# Patient Record
Sex: Female | Born: 1985 | Race: Black or African American | Hispanic: No | Marital: Single | Smoking: Never smoker
Health system: Southern US, Community
[De-identification: ages and names within clinical notes are randomized; demographics above are authoritative.]

## PROBLEM LIST (undated history)

## (undated) DIAGNOSIS — IMO0002 Reserved for concepts with insufficient information to code with codable children: Secondary | ICD-10-CM

## (undated) DIAGNOSIS — Z789 Other specified health status: Secondary | ICD-10-CM

## (undated) DIAGNOSIS — G5603 Carpal tunnel syndrome, bilateral upper limbs: Secondary | ICD-10-CM

## (undated) DIAGNOSIS — R87619 Unspecified abnormal cytological findings in specimens from cervix uteri: Secondary | ICD-10-CM

## (undated) DIAGNOSIS — E559 Vitamin D deficiency, unspecified: Secondary | ICD-10-CM

## (undated) DIAGNOSIS — D649 Anemia, unspecified: Secondary | ICD-10-CM

## (undated) DIAGNOSIS — M329 Systemic lupus erythematosus, unspecified: Secondary | ICD-10-CM

## (undated) HISTORY — PX: LYMPHADENECTOMY: SHX15

## (undated) HISTORY — DX: Unspecified abnormal cytological findings in specimens from cervix uteri: R87.619

## (undated) HISTORY — DX: Systemic lupus erythematosus, unspecified: M32.9

## (undated) HISTORY — DX: Carpal tunnel syndrome, bilateral upper limbs: G56.03

## (undated) HISTORY — PX: WISDOM TOOTH EXTRACTION: SHX21

## (undated) HISTORY — PX: NO PAST SURGERIES: SHX2092

## (undated) HISTORY — DX: Reserved for concepts with insufficient information to code with codable children: IMO0002

---
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
## 2004-09-22 ENCOUNTER — Other Ambulatory Visit: Admission: RE | Admit: 2004-09-22 | Discharge: 2004-09-22 | Payer: Self-pay | Admitting: Gynecology

## 2005-01-20 ENCOUNTER — Emergency Department (HOSPITAL_COMMUNITY): Admission: EM | Admit: 2005-01-20 | Discharge: 2005-01-20 | Payer: Self-pay | Admitting: Emergency Medicine

## 2007-10-10 ENCOUNTER — Emergency Department (HOSPITAL_COMMUNITY): Admission: EM | Admit: 2007-10-10 | Discharge: 2007-10-10 | Payer: Self-pay | Admitting: Emergency Medicine

## 2008-02-07 ENCOUNTER — Emergency Department (HOSPITAL_COMMUNITY): Admission: EM | Admit: 2008-02-07 | Discharge: 2008-02-07 | Payer: Self-pay | Admitting: Emergency Medicine

## 2009-01-24 ENCOUNTER — Emergency Department (HOSPITAL_COMMUNITY): Admission: EM | Admit: 2009-01-24 | Discharge: 2009-01-25 | Payer: Self-pay | Admitting: Emergency Medicine

## 2009-05-22 ENCOUNTER — Emergency Department (HOSPITAL_COMMUNITY): Admission: EM | Admit: 2009-05-22 | Discharge: 2009-05-22 | Payer: Self-pay | Admitting: Family Medicine

## 2010-02-24 ENCOUNTER — Inpatient Hospital Stay (HOSPITAL_COMMUNITY)
Admission: AD | Admit: 2010-02-24 | Discharge: 2010-02-27 | Payer: Self-pay | Source: Home / Self Care | Admitting: Obstetrics and Gynecology

## 2010-06-08 LAB — RH IMMUNE GLOB WKUP(>/=20WKS)(NOT WOMEN'S HOSP)
Fetal Screen: NEGATIVE
Unit division: 0

## 2010-06-08 LAB — CBC
HCT: 30.9 % — ABNORMAL LOW (ref 36.0–46.0)
Hemoglobin: 10.5 g/dL — ABNORMAL LOW (ref 12.0–15.0)
MCH: 29.4 pg (ref 26.0–34.0)
MCHC: 34.1 g/dL (ref 30.0–36.0)
MCHC: 33.6 g/dL (ref 30.0–36.0)
Platelets: 220 10*3/uL (ref 150–400)
RBC: 4.53 MIL/uL (ref 3.87–5.11)

## 2010-06-08 LAB — RPR: RPR Ser Ql: NONREACTIVE

## 2010-06-16 LAB — POCT URINALYSIS DIP (DEVICE)
Nitrite: POSITIVE — AB
Protein, ur: 100 mg/dL — AB
Urobilinogen, UA: 1 mg/dL (ref 0.0–1.0)
pH: 6 (ref 5.0–8.0)

## 2011-03-29 NOTE — L&D Delivery Note (Signed)
Delivery Note At 2:00 AM a viable and healthy female was delivered via Vaginal, Spontaneous Delivery (Presentation: Left Occiput; Anterior  ).  APGAR: 9 ,9 ; weight Pending.   Placenta status: Spontaneous, Intact .  #VC  Anesthesia: Epidural  Episiotomy: None Lacerations: 1st degree Suture Repair: 3.0 vicryl Est. Blood Loss (mL): 250cc  Mom to postpartum.  Baby to nursery-stable.  Laura Osborne H. 01/30/2012, 2:18 AM

## 2011-09-23 LAB — OB RESULTS CONSOLE ABO/RH

## 2011-09-23 LAB — OB RESULTS CONSOLE GC/CHLAMYDIA
Chlamydia: NEGATIVE
Gonorrhea: NEGATIVE

## 2011-09-23 LAB — OB RESULTS CONSOLE RUBELLA ANTIBODY, IGM: Rubella: IMMUNE

## 2011-09-23 LAB — OB RESULTS CONSOLE HIV ANTIBODY (ROUTINE TESTING): HIV: NONREACTIVE

## 2011-09-23 LAB — OB RESULTS CONSOLE RPR: RPR: NONREACTIVE

## 2012-01-03 LAB — OB RESULTS CONSOLE GBS: GBS: NEGATIVE

## 2012-01-29 ENCOUNTER — Encounter (HOSPITAL_COMMUNITY): Payer: Self-pay | Admitting: Anesthesiology

## 2012-01-29 ENCOUNTER — Inpatient Hospital Stay (HOSPITAL_COMMUNITY)
Admission: AD | Admit: 2012-01-29 | Discharge: 2012-01-31 | DRG: 373 | Disposition: A | Discharge status: Home / Self Care | Payer: BC Managed Care – PPO | Source: Ambulatory Visit | Attending: Obstetrics and Gynecology | Admitting: Obstetrics and Gynecology

## 2012-01-29 ENCOUNTER — Inpatient Hospital Stay (HOSPITAL_COMMUNITY): Payer: BC Managed Care – PPO | Admitting: Anesthesiology

## 2012-01-29 ENCOUNTER — Encounter (HOSPITAL_COMMUNITY): Payer: Self-pay | Admitting: *Deleted

## 2012-01-29 DIAGNOSIS — Z3480 Encounter for supervision of other normal pregnancy, unspecified trimester: Secondary | ICD-10-CM

## 2012-01-29 HISTORY — DX: Other specified health status: Z78.9

## 2012-01-29 LAB — CBC
HCT: 38.6 % (ref 36.0–46.0)
MCHC: 35 g/dL (ref 30.0–36.0)
Platelets: 179 10*3/uL (ref 150–400)
RDW: 13.4 % (ref 11.5–15.5)
WBC: 11.4 10*3/uL — ABNORMAL HIGH (ref 4.0–10.5)

## 2012-01-29 MED ORDER — FENTANYL 2.5 MCG/ML BUPIVACAINE 1/10 % EPIDURAL INFUSION (WH - ANES)
INTRAMUSCULAR | Status: DC | PRN
  Administered 2012-01-29: 14 mL/h via EPIDURAL

## 2012-01-29 MED ORDER — EPHEDRINE 5 MG/ML INJ: 10.0000 mg | INTRAVENOUS | Status: DC | PRN

## 2012-01-29 MED ORDER — EPHEDRINE 5 MG/ML INJ
10.0000 mg | INTRAVENOUS | Status: DC | PRN
  Filled 2012-01-29: qty 4

## 2012-01-29 MED ORDER — PHENYLEPHRINE 40 MCG/ML (10ML) SYRINGE FOR IV PUSH (FOR BLOOD PRESSURE SUPPORT)
80.0000 ug | PREFILLED_SYRINGE | INTRAVENOUS | Status: DC | PRN
  Filled 2012-01-29: qty 5

## 2012-01-29 MED ORDER — PHENYLEPHRINE 40 MCG/ML (10ML) SYRINGE FOR IV PUSH (FOR BLOOD PRESSURE SUPPORT): 80.0000 ug | PREFILLED_SYRINGE | INTRAVENOUS | Status: DC | PRN

## 2012-01-29 MED ORDER — LIDOCAINE HCL (PF) 1 % IJ SOLN
INTRAMUSCULAR | Status: DC | PRN
  Administered 2012-01-29 (×2): 4 mL

## 2012-01-29 MED ORDER — LACTATED RINGERS IV SOLN
INTRAVENOUS | Status: DC
  Administered 2012-01-29 (×2): via INTRAVENOUS

## 2012-01-29 MED ORDER — ACETAMINOPHEN 325 MG PO TABS: 650.0000 mg | ORAL_TABLET | ORAL | Status: DC | PRN

## 2012-01-29 MED ORDER — FENTANYL 2.5 MCG/ML BUPIVACAINE 1/10 % EPIDURAL INFUSION (WH - ANES)
14.0000 mL/h | INTRAMUSCULAR | Status: DC
  Filled 2012-01-29: qty 125

## 2012-01-29 MED ORDER — FLEET ENEMA 7-19 GM/118ML RE ENEM: 1.0000 | ENEMA | RECTAL | Status: DC | PRN

## 2012-01-29 MED ORDER — IBUPROFEN 600 MG PO TABS: 600.0000 mg | ORAL_TABLET | Freq: Four times a day (QID) | ORAL | Status: DC | PRN

## 2012-01-29 MED ORDER — LACTATED RINGERS IV SOLN: 500.0000 mL | INTRAVENOUS | Status: DC | PRN

## 2012-01-29 MED ORDER — OXYTOCIN 40 UNITS IN LACTATED RINGERS INFUSION - SIMPLE MED
62.5000 mL/h | INTRAVENOUS | Status: DC
  Filled 2012-01-29: qty 1000

## 2012-01-29 MED ORDER — CITRIC ACID-SODIUM CITRATE 334-500 MG/5ML PO SOLN: 30.0000 mL | ORAL | Status: DC | PRN

## 2012-01-29 MED ORDER — ONDANSETRON HCL 4 MG/2ML IJ SOLN: 4.0000 mg | Freq: Four times a day (QID) | INTRAMUSCULAR | Status: DC | PRN

## 2012-01-29 MED ORDER — OXYCODONE-ACETAMINOPHEN 5-325 MG PO TABS
1.0000 | ORAL_TABLET | ORAL | Status: DC | PRN
Start: 2012-01-29 — End: 2012-01-30

## 2012-01-29 MED ORDER — LACTATED RINGERS IV SOLN
500.0000 mL | Freq: Once | INTRAVENOUS | Status: AC
  Administered 2012-01-29: 500 mL via INTRAVENOUS

## 2012-01-29 MED ORDER — LIDOCAINE HCL (PF) 1 % IJ SOLN
30.0000 mL | INTRAMUSCULAR | Status: DC | PRN
  Filled 2012-01-29: qty 30

## 2012-01-29 MED ORDER — OXYTOCIN BOLUS FROM INFUSION
500.0000 mL | INTRAVENOUS | Status: DC
  Administered 2012-01-30: 500 mL via INTRAVENOUS

## 2012-01-29 MED ORDER — DIPHENHYDRAMINE HCL 50 MG/ML IJ SOLN: 12.5000 mg | INTRAMUSCULAR | Status: DC | PRN

## 2012-01-29 NOTE — Anesthesia Preprocedure Evaluation (Signed)

## 2012-01-29 NOTE — Progress Notes (Signed)
Dr Tenny Craw updated on SVE.  To call when pt is complete.

## 2012-01-29 NOTE — Progress Notes (Signed)
Ok for pt to walk x 1hr and recheck

## 2012-01-29 NOTE — MAU Note (Signed)
Pt G2 P1 at 39.2wks having contractions every x 1 hr, passed mucous plug this am.  Denies bleeding or problems with pregnancy.

## 2012-01-29 NOTE — Anesthesia Procedure Notes (Signed)
Epidural Patient location during procedure: OB Start time: 01/29/2012 10:40 PM  Staffing Anesthesiologist: Che Rachal A. Performed by: anesthesiologist   Preanesthetic Checklist Completed: patient identified, site marked, surgical consent, pre-op evaluation, timeout performed, IV checked, risks and benefits discussed and monitors and equipment checked  Epidural Patient position: sitting Prep: site prepped and draped and DuraPrep Patient monitoring: continuous pulse ox and blood pressure Approach: midline Injection technique: LOR air  Needle:  Needle type: Tuohy  Needle gauge: 17 G Needle length: 9 cm and 9 Needle insertion depth: 4 cm Catheter type: closed end flexible Catheter size: 19 Gauge Catheter at skin depth: 9 cm Test dose: negative and Other  Assessment Events: blood not aspirated, injection not painful, no injection resistance, negative IV test and no paresthesia  Additional Notes Patient identified. Risks and benefits discussed including failed block, incomplete  Pain control, post dural puncture headache, nerve damage, paralysis, blood pressure Changes, nausea, vomiting, reactions to medications-both toxic and allergic and post Partum back pain. All questions were answered. Patient expressed understanding and wished to proceed. Sterile technique was used throughout procedure. Epidural site was Dressed with sterile barrier dressing. No paresthesias, signs of intravascular injection Or signs of intrathecal spread were encountered.  Patient was more comfortable after the epidural was dosed. Please see RN's note for documentation of vital signs and FHR which are stable.

## 2012-01-30 ENCOUNTER — Encounter (HOSPITAL_COMMUNITY): Payer: Self-pay | Admitting: *Deleted

## 2012-01-30 MED ORDER — SENNOSIDES-DOCUSATE SODIUM 8.6-50 MG PO TABS
2.0000 | ORAL_TABLET | Freq: Every day | ORAL | Status: DC
  Administered 2012-01-30: 2 via ORAL

## 2012-01-30 MED ORDER — ONDANSETRON HCL 4 MG/2ML IJ SOLN: 4.0000 mg | INTRAMUSCULAR | Status: DC | PRN

## 2012-01-30 MED ORDER — PRENATAL MULTIVITAMIN CH
1.0000 | ORAL_TABLET | Freq: Every day | ORAL | Status: DC
  Administered 2012-01-30: 1 via ORAL
  Filled 2012-01-30 (×2): qty 1

## 2012-01-30 MED ORDER — DIPHENHYDRAMINE HCL 25 MG PO CAPS: 25.0000 mg | ORAL_CAPSULE | Freq: Four times a day (QID) | ORAL | Status: DC | PRN

## 2012-01-30 MED ORDER — LANOLIN HYDROUS EX OINT: TOPICAL_OINTMENT | CUTANEOUS | Status: DC | PRN

## 2012-01-30 MED ORDER — ZOLPIDEM TARTRATE 5 MG PO TABS: 5.0000 mg | ORAL_TABLET | Freq: Every evening | ORAL | Status: DC | PRN

## 2012-01-30 MED ORDER — RHO D IMMUNE GLOBULIN 1500 UNIT/2ML IJ SOLN
300.0000 ug | Freq: Once | INTRAMUSCULAR | Status: AC
  Administered 2012-01-30: 300 ug via INTRAMUSCULAR
  Filled 2012-01-30: qty 2

## 2012-01-30 MED ORDER — SIMETHICONE 80 MG PO CHEW: 80.0000 mg | CHEWABLE_TABLET | ORAL | Status: DC | PRN

## 2012-01-30 MED ORDER — OXYCODONE-ACETAMINOPHEN 5-325 MG PO TABS: 1.0000 | ORAL_TABLET | ORAL | Status: DC | PRN

## 2012-01-30 MED ORDER — IBUPROFEN 600 MG PO TABS
600.0000 mg | ORAL_TABLET | Freq: Four times a day (QID) | ORAL | Status: DC
  Administered 2012-01-30 – 2012-01-31 (×5): 600 mg via ORAL
  Filled 2012-01-30 (×6): qty 1

## 2012-01-30 MED ORDER — DIBUCAINE 1 % RE OINT: 1.0000 "application " | TOPICAL_OINTMENT | RECTAL | Status: DC | PRN

## 2012-01-30 MED ORDER — WITCH HAZEL-GLYCERIN EX PADS: 1.0000 "application " | MEDICATED_PAD | CUTANEOUS | Status: DC | PRN

## 2012-01-30 MED ORDER — TETANUS-DIPHTH-ACELL PERTUSSIS 5-2.5-18.5 LF-MCG/0.5 IM SUSP: 0.5000 mL | Freq: Once | INTRAMUSCULAR | Status: DC

## 2012-01-30 MED ORDER — BENZOCAINE-MENTHOL 20-0.5 % EX AERO: 1.0000 "application " | INHALATION_SPRAY | CUTANEOUS | Status: DC | PRN

## 2012-01-30 MED ORDER — ONDANSETRON HCL 4 MG PO TABS: 4.0000 mg | ORAL_TABLET | ORAL | Status: DC | PRN

## 2012-01-30 NOTE — Anesthesia Postprocedure Evaluation (Signed)
  Anesthesia Post-op Note  Patient: Laura Osborne  Procedure(s) Performed: * No procedures listed *  Patient Location: Mother/Baby  Anesthesia Type:Epidural  Level of Consciousness: awake, alert  and oriented  Airway and Oxygen Therapy: Patient Spontanous Breathing  Post-op Pain: none  Post-op Assessment: Post-op Vital signs reviewed and Patient's Cardiovascular Status Stable  Post-op Vital Signs: Reviewed and stable  Complications: No apparent anesthesia complications

## 2012-01-30 NOTE — H&P (Signed)
Laura Osborne is a 26 y.o. female presenting for contractions 26 yo G2P1001 presents for painful contractions and was deemed to be in labor. Her pregnancy has been complicated by late prenatal care. Otherwise uncomplicated History OB History    Grav Para Term Preterm Abortions TAB SAB Ect Mult Living   2 1 1       1      Past Medical History  Diagnosis Date  . No pertinent past medical history    Past Surgical History  Procedure Date  . No past surgeries    Family History: family history is not on file. Social History:  reports that she has never smoked. She does not have any smokeless tobacco history on file. She reports that she does not drink alcohol or use illicit drugs.   Prenatal Transfer Tool  Maternal Diabetes: No Genetic Screening: Declined Maternal Ultrasounds/Referrals: Normal Fetal Ultrasounds or other Referrals:  None Maternal Substance Abuse:  No Significant Maternal Medications:  None Significant Maternal Lab Results:  None Other Comments:  None  ROS: as above  Dilation: 10 Effacement (%): 100 Station: +2 Exam by:: L Lamon RN Blood pressure 135/65, pulse 65, temperature 98 F (36.7 C), temperature source Oral, resp. rate 18, height 5\' 4"  (1.626 m), weight 77.565 kg (171 lb), SpO2 98.00%. Exam Physical Exam  Prenatal labs: ABO, Rh: AB/Negative/-- (06/28 0000) Antibody: Negative (06/28 0000) Rubella: Immune (06/28 0000) RPR: NON REACTIVE (11/03 2143)  HBsAg: Negative (06/28 0000)  HIV: Non-reactive (06/28 0000)  GBS: Negative (10/08 0000)   Assessment/Plan: Admit Epidural    Philippe Gang H. 01/30/2012, 2:15 AM

## 2012-01-30 NOTE — Progress Notes (Signed)
Dr Tenny Craw notified that pt is complete. Dr is on her way to hospital.

## 2012-01-31 LAB — RH IG WORKUP (INCLUDES ABO/RH)
Fetal Screen: NEGATIVE
Unit division: 0

## 2012-01-31 LAB — CBC
MCH: 28.1 pg (ref 26.0–34.0)
MCHC: 33.4 g/dL (ref 30.0–36.0)
Platelets: 169 10*3/uL (ref 150–400)

## 2012-01-31 MED ORDER — OXYCODONE-ACETAMINOPHEN 5-325 MG PO TABS: 1.0000 | ORAL_TABLET | ORAL | Status: DC | PRN

## 2012-01-31 NOTE — Discharge Summary (Signed)
Obstetric Discharge Summary Reason for Admission: onset of labor Prenatal Procedures: ultrasound Intrapartum Procedures: spontaneous vaginal delivery Postpartum Procedures: none Complications-Operative and Postpartum: 1 degree perineal laceration Hemoglobin  Date Value Range Status  01/31/2012 11.0* 12.0 - 15.0 g/dL Final     DELTA CHECK NOTED     REPEATED TO VERIFY     HCT  Date Value Range Status  01/31/2012 32.6* 36.0 - 46.0 % Final    Physical Exam:  General: alert Lochia: appropriate Uterine Fundus: firm   Discharge Diagnoses: Term Pregnancy-delivered  Discharge Information: Date: 01/31/2012 Activity: pelvic rest Diet: routine Medications: PNV, Ibuprofen and Percocet Condition: stable Instructions: refer to practice specific booklet Discharge to: home Follow-up Information    Follow up with Almon Hercules., MD. Schedule an appointment as soon as possible for a visit in 4 weeks.   Contact information:   [address] [place] [address] [phone]          Newborn Data: Live born female  Birth Weight: 6 lb 13.5 oz (3105 g) APGAR: 9, 9  Home with mother.  Laura Osborne E 01/31/2012, 7:57 AM

## 2012-01-31 NOTE — Progress Notes (Signed)
UR chart review completed.  

## 2012-01-31 NOTE — Progress Notes (Signed)
Pt would like to go home. NO complaints. VSSAF IMP/ doing well. Plan/ discharge.

## 2014-01-27 ENCOUNTER — Encounter (HOSPITAL_COMMUNITY): Payer: Self-pay | Admitting: *Deleted

## 2015-03-29 HISTORY — PX: CERVICAL BIOPSY  W/ LOOP ELECTRODE EXCISION: SUR135

## 2015-04-22 ENCOUNTER — Ambulatory Visit (INDEPENDENT_AMBULATORY_CARE_PROVIDER_SITE_OTHER): Payer: BLUE CROSS/BLUE SHIELD | Admitting: Gynecology

## 2015-04-22 ENCOUNTER — Other Ambulatory Visit (HOSPITAL_COMMUNITY)
Admission: RE | Admit: 2015-04-22 | Discharge: 2015-04-22 | Disposition: A | Discharge status: Home / Self Care | Payer: BLUE CROSS/BLUE SHIELD | Source: Ambulatory Visit | Attending: Gynecology | Admitting: Gynecology

## 2015-04-22 ENCOUNTER — Encounter: Payer: Self-pay | Admitting: Gynecology

## 2015-04-22 VITALS — BP 126/78 | Ht 64.25 in | Wt 159.0 lb

## 2015-04-22 DIAGNOSIS — Z01411 Encounter for gynecological examination (general) (routine) with abnormal findings: Secondary | ICD-10-CM | POA: Diagnosis present

## 2015-04-22 DIAGNOSIS — R87810 Cervical high risk human papillomavirus (HPV) DNA test positive: Secondary | ICD-10-CM | POA: Diagnosis present

## 2015-04-22 DIAGNOSIS — Z1151 Encounter for screening for human papillomavirus (HPV): Secondary | ICD-10-CM | POA: Insufficient documentation

## 2015-04-22 DIAGNOSIS — Z01419 Encounter for gynecological examination (general) (routine) without abnormal findings: Secondary | ICD-10-CM

## 2015-04-22 LAB — CBC WITH DIFFERENTIAL/PLATELET
BASOS ABS: 0 10*3/uL (ref 0.0–0.1)
BASOS PCT: 0 % (ref 0–1)
EOS ABS: 0.1 10*3/uL (ref 0.0–0.7)
EOS PCT: 2 % (ref 0–5)
HCT: 39.2 % (ref 36.0–46.0)
Hemoglobin: 13.2 g/dL (ref 12.0–15.0)
LYMPHS PCT: 26 % (ref 12–46)
Lymphs Abs: 1.6 10*3/uL (ref 0.7–4.0)
MCH: 28.6 pg (ref 26.0–34.0)
MCHC: 33.7 g/dL (ref 30.0–36.0)
MCV: 85 fL (ref 78.0–100.0)
MONO ABS: 0.5 10*3/uL (ref 0.1–1.0)
MPV: 9.6 fL (ref 8.6–12.4)
Monocytes Relative: 9 % (ref 3–12)
Neutro Abs: 3.8 10*3/uL (ref 1.7–7.7)
Neutrophils Relative %: 63 % (ref 43–77)
PLATELETS: 294 10*3/uL (ref 150–400)
RBC: 4.61 MIL/uL (ref 3.87–5.11)
RDW: 13 % (ref 11.5–15.5)
WBC: 6.1 10*3/uL (ref 4.0–10.5)

## 2015-04-22 LAB — COMPREHENSIVE METABOLIC PANEL
ALBUMIN: 4 g/dL (ref 3.6–5.1)
ALT: 10 U/L (ref 6–29)
AST: 13 U/L (ref 10–30)
Alkaline Phosphatase: 53 U/L (ref 33–115)
BILIRUBIN TOTAL: 0.4 mg/dL (ref 0.2–1.2)
BUN: 11 mg/dL (ref 7–25)
CHLORIDE: 105 mmol/L (ref 98–110)
CO2: 23 mmol/L (ref 20–31)
CREATININE: 0.74 mg/dL (ref 0.50–1.10)
Calcium: 9.3 mg/dL (ref 8.6–10.2)
GLUCOSE: 72 mg/dL (ref 65–99)
Potassium: 4.2 mmol/L (ref 3.5–5.3)
SODIUM: 138 mmol/L (ref 135–146)
Total Protein: 7.6 g/dL (ref 6.1–8.1)

## 2015-04-22 LAB — CHOLESTEROL, TOTAL: CHOLESTEROL: 146 mg/dL (ref 125–200)

## 2015-04-22 NOTE — Progress Notes (Signed)
Laura Osborne 1985/07/06 [id number]   History:    30 y.o.  for annual gyn exam who is a new patient to the practice with no complaints today. Patient stated that her last gynecological examination was approximate 3 years ago. She reports no past history of any abnormal Pap smear. She also had a Nexplanon placed on her left arm 3 years ago and needs to be removed and changed at the end of this month. She did not have the HPV vaccine when she was younger. She declined flu vaccine today.  Past medical history,surgical history, family history and social history were all reviewed and documented in the EPIC chart.  Gynecologic History Patient's last menstrual period was 03/30/2015. Contraception: Nexplanon Last Pap: 3 years ago. Results were: normal Last mammogram: Not indicated. Results were: Not indicated  Obstetric History OB History  Gravida Para Term Preterm AB SAB TAB Ectopic Multiple Living  # Outcome Date GA Lbr Len/2nd Weight Sex Delivery Anes PTL Lv  2 Term 01/30/12 [redacted]w[redacted]d 03:31 / 01:29 6 lb 13.5 oz (3.105 kg) F Vag-Spont EPI  Y  1 Term                ROS: A ROS was performed and pertinent positives and negatives are included in the history.  GENERAL: No fevers or chills. HEENT: No change in vision, no earache, sore throat or sinus congestion. NECK: No pain or stiffness. CARDIOVASCULAR: No chest pain or pressure. No palpitations. PULMONARY: No shortness of breath, cough or wheeze. GASTROINTESTINAL: No abdominal pain, nausea, vomiting or diarrhea, melena or bright red blood per rectum. GENITOURINARY: No urinary frequency, urgency, hesitancy or dysuria. MUSCULOSKELETAL: No joint or muscle pain, no back pain, no recent trauma. DERMATOLOGIC: No rash, no itching, no lesions. ENDOCRINE: No polyuria, polydipsia, no heat or cold intolerance. No recent change in weight. HEMATOLOGICAL: No anemia or easy bruising or bleeding. NEUROLOGIC: No headache, seizures, numbness,  tingling or weakness. PSYCHIATRIC: No depression, no loss of interest in normal activity or change in sleep pattern.     Exam: chaperone present  BP 126/78 mmHg  Ht 5' 4.25" (1.632 m)  Wt 159 lb (72.122 kg)  BMI 27.08 kg/m2  LMP 03/30/2015  Body mass index is 27.08 kg/(m^2).  General appearance : Well developed well nourished female. No acute distress HEENT: Eyes: no retinal hemorrhage or exudates,  Neck supple, trachea midline, no carotid bruits, no thyroidmegaly Lungs: Clear to auscultation, no rhonchi or wheezes, or rib retractions  Heart: Regular rate and rhythm, no murmurs or gallops Breast:Examined in sitting and supine position were symmetrical in appearance, no palpable masses or tenderness,  no skin retraction, no nipple inversion, no nipple discharge, no skin discoloration, no axillary or supraclavicular lymphadenopathy Abdomen: no palpable masses or tenderness, no rebound or guarding Extremities: no edema or skin discoloration or tenderness  Pelvic:  Bartholin, Urethra, Skene Glands: Within normal limits             Vagina: No gross lesions or discharge  Cervix: No gross lesions or discharge  Uterus  anteverted, normal size, shape and consistency, non-tender and mobile  Adnexa  Without masses or tenderness  Anus and perineum  normal   Rectovaginal  normal sphincter tone without palpated masses or tenderness             Hemoccult not indicated     Assessment/Plan:  30 y.o. female for annual exam  will return back to the office at the end of the month to remove her expired Nexplanon and replaced with a new one. The following screening blood work was ordered today: Comprehensive metabolic panel, CBC, screening cholesterol, urinalysis. Pap smear with HPV screening done today. Patient declined flu vaccine.   Ok Edwards MD, 2:17 PM 04/22/2015

## 2015-04-23 LAB — URINALYSIS W MICROSCOPIC + REFLEX CULTURE
BACTERIA UA: NONE SEEN [HPF]
BILIRUBIN URINE: NEGATIVE
CRYSTALS: NONE SEEN [HPF]
Casts: NONE SEEN [LPF]
GLUCOSE, UA: NEGATIVE
HGB URINE DIPSTICK: NEGATIVE
Ketones, ur: NEGATIVE
LEUKOCYTES UA: NEGATIVE
Nitrite: NEGATIVE
PROTEIN: NEGATIVE
RBC / HPF: NONE SEEN RBC/HPF (ref <=2)
Specific Gravity, Urine: 1.019 (ref 1.001–1.035)
YEAST: NONE SEEN [HPF]
pH: 7.5 (ref 5.0–8.0)

## 2015-04-24 LAB — CYTOLOGY - PAP

## 2015-04-26 LAB — URINE CULTURE

## 2015-04-27 ENCOUNTER — Telehealth: Payer: Self-pay | Admitting: Gynecology

## 2015-04-27 ENCOUNTER — Other Ambulatory Visit: Payer: Self-pay | Admitting: Gynecology

## 2015-04-27 MED ORDER — NITROFURANTOIN MONOHYD MACRO 100 MG PO CAPS: 100.0000 mg | ORAL_CAPSULE | Freq: Two times a day (BID) | ORAL | Status: DC

## 2015-04-27 NOTE — Telephone Encounter (Signed)
04/27/15-I spoke with pt today to let her know that her [facility] will cover the replacement of her Nexplanon for contraception at 100%, no copay or deductible per [facility], no ref# given.wl

## 2015-05-13 ENCOUNTER — Ambulatory Visit (INDEPENDENT_AMBULATORY_CARE_PROVIDER_SITE_OTHER): Payer: BLUE CROSS/BLUE SHIELD | Admitting: Gynecology

## 2015-05-13 ENCOUNTER — Encounter: Payer: Self-pay | Admitting: Gynecology

## 2015-05-13 VITALS — BP 120/82

## 2015-05-13 DIAGNOSIS — Z30430 Encounter for insertion of intrauterine contraceptive device: Secondary | ICD-10-CM | POA: Diagnosis not present

## 2015-05-13 DIAGNOSIS — Z30017 Encounter for initial prescription of implantable subdermal contraceptive: Secondary | ICD-10-CM

## 2015-05-13 DIAGNOSIS — Z30433 Encounter for removal and reinsertion of intrauterine contraceptive device: Secondary | ICD-10-CM

## 2015-05-13 DIAGNOSIS — Z3046 Encounter for surveillance of implantable subdermal contraceptive: Secondary | ICD-10-CM

## 2015-05-13 DIAGNOSIS — Z975 Presence of (intrauterine) contraceptive device: Secondary | ICD-10-CM | POA: Insufficient documentation

## 2015-05-13 NOTE — Patient Instructions (Signed)
Etonogestrel implant What is this medicine? ETONOGESTREL (et oh noe JES trel) is a contraceptive (birth control) device. It is used to prevent pregnancy. It can be used for up to 3 years. This medicine may be used for other purposes; ask your health care provider or pharmacist if you have questions. What should I tell my health care provider before I take this medicine? They need to know if you have any of these conditions: -abnormal vaginal bleeding -blood vessel disease or blood clots -cancer of the breast, cervix, or liver -depression -diabetes -gallbladder disease -headaches -heart disease or recent heart attack -high blood pressure -high cholesterol -kidney disease -liver disease -renal disease -seizures -tobacco smoker -an unusual or allergic reaction to etonogestrel, other hormones, anesthetics or antiseptics, medicines, foods, dyes, or preservatives -pregnant or trying to get pregnant -breast-feeding How should I use this medicine? This device is inserted just under the skin on the inner side of your upper arm by a health care professional. Talk to your pediatrician regarding the use of this medicine in children. Special care may be needed. Overdosage: If you think you have taken too much of this medicine contact a poison control center or emergency room at once. NOTE: This medicine is only for you. Do not share this medicine with others. What if I miss a dose? This does not apply. What may interact with this medicine? Do not take this medicine with any of the following medications: -amprenavir -bosentan -fosamprenavir This medicine may also interact with the following medications: -barbiturate medicines for inducing sleep or treating seizures -certain medicines for fungal infections like ketoconazole and itraconazole -griseofulvin -medicines to treat seizures like carbamazepine, felbamate, oxcarbazepine, phenytoin,  topiramate -modafinil -phenylbutazone -rifampin -some medicines to treat HIV infection like atazanavir, indinavir, lopinavir, nelfinavir, tipranavir, ritonavir -St. John's wort This list may not describe all possible interactions. Give your health care provider a list of all the medicines, herbs, non-prescription drugs, or dietary supplements you use. Also tell them if you smoke, drink alcohol, or use illegal drugs. Some items may interact with your medicine. What should I watch for while using this medicine? This product does not protect you against HIV infection (AIDS) or other sexually transmitted diseases. You should be able to feel the implant by pressing your fingertips over the skin where it was inserted. Contact your doctor if you cannot feel the implant, and use a non-hormonal birth control method (such as condoms) until your doctor confirms that the implant is in place. If you feel that the implant may have broken or become bent while in your arm, contact your healthcare provider. What side effects may I notice from receiving this medicine? Side effects that you should report to your doctor or health care professional as soon as possible: -allergic reactions like skin rash, itching or hives, swelling of the face, lips, or tongue -breast lumps -changes in emotions or moods -depressed mood -heavy or prolonged menstrual bleeding -pain, irritation, swelling, or bruising at the insertion site -scar at site of insertion -signs of infection at the insertion site such as fever, and skin redness, pain or discharge -signs of pregnancy -signs and symptoms of a blood clot such as breathing problems; changes in vision; chest pain; severe, sudden headache; pain, swelling, warmth in the leg; trouble speaking; sudden numbness or weakness of the face, arm or leg -signs and symptoms of liver injury like dark yellow or brown urine; general ill feeling or flu-like symptoms; light-colored stools; loss of  appetite; nausea; right upper belly   pain; unusually weak or tired; yellowing of the eyes or skin -unusual vaginal bleeding, discharge -signs and symptoms of a stroke like changes in vision; confusion; trouble speaking or understanding; severe headaches; sudden numbness or weakness of the face, arm or leg; trouble walking; dizziness; loss of balance or coordination Side effects that usually do not require medical attention (Report these to your doctor or health care professional if they continue or are bothersome.): -acne -back pain -breast pain -changes in weight -dizziness -general ill feeling or flu-like symptoms -headache -irregular menstrual bleeding -nausea -sore throat -vaginal irritation or inflammation This list may not describe all possible side effects. Call your doctor for medical advice about side effects. You may report side effects to FDA at 1-800-FDA-1088. Where should I keep my medicine? This drug is given in a hospital or clinic and will not be stored at home. NOTE: This sheet is a summary. It may not cover all possible information. If you have questions about this medicine, talk to your doctor, pharmacist, or health care provider.    2016, Elsevier/Gold Standard. (2013-12-27 14:07:06)  

## 2015-05-13 NOTE — Progress Notes (Signed)
Patient is a 30 year old was seen the office as a new patient for the first time in January of this year. She had mentioned that her Nexplanon was due to be changed because it been 3 years and this is the reason for visit today. Her screening blood work were all normal but her Pap smear demonstrated the following:  Diagnosis LOW GRADE SQUAMOUS INTRAEPITHELIAL LESION: CIN-1/ HPV (LSIL); HOWEVER, THERE IS A RARE CELL WHERE A HIGHER GRADE LESION CANNOT BE COMPLETELY EXCLUDED. Zandra Abts MD Pathologist, Electronic Signature (Case signed 04/27/2015) Source CervicoVaginal Pap [ThinPrep Imaged] Molecular Results Test Result HPV 16,18/45 Genotyping NEGATIVE FOR HPV 16 & 18/45 HPV High Risk DETECTED  She is already scheduled for colposcopy next week.                                                             Nexplanon procedure note (removal)  The patient presented to the office today requesting for removal of her Nexplanon that was placed in the year 2014 on her left arm.   On examination the nexplanon implant was palpated and the distal end  (end  closest to the elbow) was marked. The area was sterilized with Betadine solution. 1% lidocaine was used for local anesthesia and approximately 1 cc  was injected into the site that was marked where  the incision was to be made. The local anesthetic was injected under the implant in an effort to keep it  close to the skin surface. Slight pressure pushing downward was made at the proximal end  of the implant in an effort to stabilize it. A bulge appeared indicating the distal end of the implant. Starting at the distal tip of the implant, a small longitudinal incision of 2 mm was made towards the elbow. By gently pushing the implant toward the incision the tip became visible. Grasping the implant with a curved forcep facilitated in gently removing the implant. Full confirmation of the entire implant which is 4 cm long was inspected and was intact and was  shown to the patient and discarded. After removing the implant:                                              Nexplanon Procedure Note (insertion)      The applicator was held above the needle at the textured surface area. The transparent protector was removed. With a freehand, the skin was stretched around the insertion site with a thumb and index finger. The skin was then entered to the previous incision site to removed the oral Nexplanon and with the tip of the needle angled at 30. The Nexplanon applicator was lowered to a horizontal position. While lifting the skin with the tip of the needle the needle was then slid to its full length. The applicator was kept in sitting position with a needle inserted to its full length. The purple slider was unlocked by pushing it slightly downward. The slider was fully moved back until it stopped. This allowed the implant to be in the final subdermal position and the needle was locked inside the body of the applicator. 2 interrupted sutures of 4-0 Vicryl suture was  placed to approximate the skin edges followed by 2 x 2 dressing and a Kerlix wrap which she will remove tomorrow. She'll return back to the office early next week to remove her sutures and later that week she is scheduled for colposcopy as a result of her abnormal Pap smears described above.  [facility] HMD11:53 AMTD@

## 2015-05-14 ENCOUNTER — Encounter: Payer: Self-pay | Admitting: Gynecology

## 2015-05-18 ENCOUNTER — Ambulatory Visit (INDEPENDENT_AMBULATORY_CARE_PROVIDER_SITE_OTHER): Payer: BLUE CROSS/BLUE SHIELD | Admitting: Gynecology

## 2015-05-18 ENCOUNTER — Encounter: Payer: Self-pay | Admitting: Gynecology

## 2015-05-18 VITALS — BP 122/84 | Ht 64.0 in | Wt 159.0 lb

## 2015-05-18 DIAGNOSIS — Z4802 Encounter for removal of sutures: Secondary | ICD-10-CM

## 2015-05-18 NOTE — Progress Notes (Signed)
   Patient is a 30 year old who presented to the office today to remove the sutures that were placed after her Nexplanon had been removed and replaced with a new one on 05/13/2015. She has done well. She is scheduled to return to the office at the end of the week for colposcopic evaluation as a result of her recent Pap smear demonstrated the following:  Diagnosis LOW GRADE SQUAMOUS INTRAEPITHELIAL LESION: CIN-1/ HPV (LSIL); HOWEVER, THERE IS A RARE CELL WHERE A HIGHER GRADE LESION CANNOT BE COMPLETELY EXCLUDED. Zandra Abts MD Pathologist, Electronic Signature (Case signed 04/27/2015) Source CervicoVaginal Pap [ThinPrep Imaged] Molecular Results Test Result HPV 16,18/45 Genotyping NEGATIVE FOR HPV 16 & 18/45 HPV High Risk DETECTED   Left arm suture 2 removed Neosporin applied and a Band-Aid.

## 2015-05-21 ENCOUNTER — Encounter: Payer: Self-pay | Admitting: Gynecology

## 2015-05-21 ENCOUNTER — Ambulatory Visit (INDEPENDENT_AMBULATORY_CARE_PROVIDER_SITE_OTHER): Payer: BLUE CROSS/BLUE SHIELD | Admitting: Gynecology

## 2015-05-21 VITALS — BP 132/90

## 2015-05-21 DIAGNOSIS — R87612 Low grade squamous intraepithelial lesion on cytologic smear of cervix (LGSIL): Secondary | ICD-10-CM | POA: Diagnosis not present

## 2015-05-21 DIAGNOSIS — IMO0002 Reserved for concepts with insufficient information to code with codable children: Secondary | ICD-10-CM | POA: Insufficient documentation

## 2015-05-21 DIAGNOSIS — R896 Abnormal cytological findings in specimens from other organs, systems and tissues: Secondary | ICD-10-CM | POA: Diagnosis not present

## 2015-05-21 NOTE — Patient Instructions (Signed)
Colposcopa - Cuidados posteriores (Colposcopy, Care After) Siga estas instrucciones durante las prximas semanas. Estas indicaciones le proporcionan informacin general acerca de cmo deber cuidarse despus del procedimiento. El mdico tambin podr darle instrucciones ms especficas. El tratamiento se ha planificado de acuerdo a las prcticas mdicas actuales, pero a veces se producen problemas. Comunquese con el mdico si tiene algn problema o tiene dudas despus del procedimiento. QU ESPERAR DESPUS DEL PROCEDIMIENTO  Despus del procedimiento, es tpico tener las siguientes sensaciones:  Clicos. Generalmente se calman en algunos minutos.  Dolor. Puede durar hasta dos das.  Aturdimiento. Si esto le ocurre, recustese durante algunos minutos. Podr tener un sangrado leve o una secrecin oscura que debe detenerse en algunos das. Durante este tiempo deber usar un apsito sanitario. INSTRUCCIONES PARA EL CUIDADO EN EL HOGAR  Evite las relaciones sexuales, las duchas vaginales y el uso de tampones durante 3 das, o segn lo que le indique su mdico.  Tome slo medicamentos de venta libre o recetados, segn las indicaciones del mdico. No tome aspirina, ya que puede causar hemorragias.  Si utiliza pldoras anticonceptivas, contine tomndolas.  No todos los resultados estarn disponibles durante su visita. En este caso, tenga otra entrevista con su mdico para conocerlos. No suponga que es normal si no tiene noticias de su mdico o del establecimiento de salud. Es importante el seguimiento de todos los resultados de los estudios.  Siga los consejos de su mdico con respecto a los medicamentos, actividades, visitas y [place] de control. SOLICITE ATENCIN MDICA SI:  Aparece una erupcin cutnea.  Tiene problemas con los medicamentos. SOLICITE ATENCIN MDICA DE INMEDIATO SI:  Tiene una hemorragia abundante o elimina cogulos.  Tiene fiebre.  Tiene flujo vaginal  anormal.  Tiene clicos que no se alivian luego de tomar analgsicos.  Se siente mareada, tiene vahdos o se desmaya.  Siente dolor en el estmago.   Esta informacin no tiene como fin reemplazar el consejo del mdico. Asegrese de hacerle al mdico cualquier pregunta que tenga.   Document Released: 01/02/2013 Elsevier Interactive Patient Education 2016 Elsevier Inc.  

## 2015-05-21 NOTE — Addendum Note (Signed)
Addended by: Berna Spare A on: 05/21/2015 11:32 AM   Modules accepted: Orders

## 2015-05-21 NOTE — Progress Notes (Signed)
   Patient is a 30 year old was seen in the office as a new patient for the first time on 04/02/2015. The reason for today's visit as a result of patient's abnormal Pap smear recently which demonstrated the following:  Diagnosis LOW GRADE SQUAMOUS INTRAEPITHELIAL LESION: CIN-1/ HPV (LSIL); HOWEVER, THERE IS A RARE CELL WHERE A HIGHER GRADE LESION CANNOT BE COMPLETELY EXCLUDED. Zandra Abts MD Pathologist, Electronic Signature (Case signed 04/27/2015) Source CervicoVaginal Pap [ThinPrep Imaged] Molecular Results Test Result HPV 16,18/45 Genotyping NEGATIVE FOR HPV 16 & 18/45 HPV High Risk DETECTED  Patient was counseled for colposcopic evaluation. Patient underwent a detail colposcopic evaluation of the external genitalia, perineum and perirectal region were no lesions seen. The speculum was introduced into the vagina and a systematic inspection vagina, fornix and cervix was undertaken. Acetowhite areas were noted at the 11, 12, and 1:00 position of the ectocervix. With the use of the endocervical speculum the transformation zone was visualized entirely. These 3 areas were respectively biopsy along with an ECC and labeled appropriately and submitted for histological evaluation. Pictures from finding as follows:  Physical Exam  Genitourinary:     Assessment/plan: Pap smear with low-grade squamous intraepithelial lesion high risk HPV detected but negative for HPV 16, 18 and 45. Detail colposcopic evaluation with abnormal findings as described above. Biopsies obtained. Pathology report pending. Will notify patient with results of findings and manage accordingly. I have shared with her the new ASCCP guidelines to managing abnormal Pap smears. Literature information provided.

## 2015-06-10 ENCOUNTER — Ambulatory Visit: Payer: BLUE CROSS/BLUE SHIELD | Admitting: Gynecology

## 2015-07-27 ENCOUNTER — Ambulatory Visit: Payer: BLUE CROSS/BLUE SHIELD | Admitting: Gynecology

## 2015-09-02 ENCOUNTER — Encounter: Payer: Self-pay | Admitting: Gynecology

## 2015-09-02 ENCOUNTER — Ambulatory Visit (INDEPENDENT_AMBULATORY_CARE_PROVIDER_SITE_OTHER): Payer: 59 | Admitting: Gynecology

## 2015-09-02 VITALS — BP 122/80 | Ht 64.0 in | Wt 159.0 lb

## 2015-09-02 DIAGNOSIS — N871 Moderate cervical dysplasia: Secondary | ICD-10-CM | POA: Insufficient documentation

## 2015-09-02 NOTE — Progress Notes (Signed)
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 Patient currently is using the Nexplanon for contraception. The following outlines her past dysplasia and colposcopic directed biopsies and pathology report:  Pap smear 04/02/2015: Diagnosis LOW GRADE SQUAMOUS INTRAEPITHELIAL LESION: CIN-1/ HPV (LSIL); HOWEVER, THERE IS A RARE CELL WHERE A HIGHER GRADE LESION CANNOT BE COMPLETELY EXCLUDED. Laura Abts MD Pathologist, Electronic Signature (Case signed 04/27/2015) Source CervicoVaginal Pap [ThinPrep Imaged] Molecular Results Test Result HPV 16,18/45 Genotyping NEGATIVE FOR HPV 16 & 18/45 HPV High Risk DETECTED  Patient underwent colposcopy and biopsies every 23rd 2017: Patient was counseled for colposcopic evaluation. Patient underwent a detail colposcopic evaluation of the external genitalia, perineum and perirectal region were no lesions seen. The speculum was introduced into the vagina and a systematic inspection vagina, fornix and cervix was undertaken. Acetowhite areas were noted at the 11, 12, and 1:00 position of the ectocervix. With the use of the endocervical speculum the transformation zone was visualized entirely. These 3 areas were respectively biopsy along with an ECC and labeled appropriately and submitted for histological evaluation. Pictures from finding as follows:  Physical Exam  Genitourinary:     Pathology report demonstrated the following: Diagnosis 1. Endocervix, curettage - DETACHED BENIGN ENDOCERVICAL MUCOSAL FRAGMENTS. - DETACHED BENIGN SQUAMOUS CELLS. - NO DYSPLASIA, ATYPIA OR MALIGNANCY IDENTIFIED. 2. Cervix, biopsy, 1:00 o'clock - TRANSFORMATION ZONE MUCOSA WITH LOW GRADE SQUAMOUS INTRAEPITHELIAL LESION, CIN-I (MILD DYSPLASIA). - ACUTE AND CHRONIC CERVICITIS. 3. Cervix, biopsy, 11:00 o'clock - TRANSFORMATION ZONE MUCOSA WITH HIGH GRADE SQUAMOUS INTRAEPITHELIAL  LESION, CIN-II (MODERATE DYSPLASIA). - ACUTE AND CHRONIC CERVICITIS. 4. Cervix, biopsy, 12:00 o'clock - TRANSFORMATION ZONE MUCOSA WITH HIGH GRADE SQUAMOUS INTRAEPITHELIAL LESION, CIN-II (MODERATE DYSPLASIA). - ACUTE AND CHRONIC CERVICITIS.  Patient was counseled for LEEP cervical conization:  LEEP (Leep electrosurgical excision procedure)    Patient Name:Laura Osborne  Record [id number]  Indication For Surgery: CIN-2  Surgeon: Laura Osborne  Anesthesia: 2% lidocaine   Procedure:  LEEP (Loop electrosurgical excision procedure) Description of Operation:  After the patient was verbally counseled the patient was placed in the low lithotomy position.  A coated speculum was inserted into the vagina and colposcopic examination was performed with 4% acidic acid with findings noted above.  The cervix was then painted with Lugol's solution to delineate the margins of the lesion and transformation zone.  Approximately 5 cc's of 2% xylocaine with epinephrine was infiltrated deep near the outer margin of the transformation zone circumferentially at 12, 3, 6, and 9 o'clock positions.  The [facility] Electrosurgical Generator was then turned on after the patient was grounded with pad electrode on her thigh and jewelry removed.  The settings on the generator were Blend 1 current 70 watts cut and 70 watts on the coagulation mode.  A size 20 x 12 loop electrode was utilized to exercise the atypical transformation zone.  The tip of the electrode was placed 3 mm from the edge of the lesion at 3, 6, 9 and 12 o'clock position.  The electrode was moved slowly over the lesion was within the loop limits.  A vaginal wall retractor was used.  The loop was then repositioned and the finger switch on the hand held piece was activated ( or footpedal depressed).  A slight pressure on the shaft was applied and the loop was extended into the tissue up to its crossbar to a depth of 6 mm, then  with  steady, slow motion across and underneath the endocervical button was not excised.  The loop electrode was replaced with ball electrode set at 50 watts and the base of the crater was fulgurated circumferentially.  Monsell's paint was then applied for additional hemostasis.  A suture was placed at 12 o'clock position of cervical biopsy specimen for orientation, and placed in formation fixative for pathology evaluation.  Patient tolerated the procedure well with minimal blood loss and without any complications.  After the procedure patient left office with stable vital signs and instructions sheet.  Patient to return to the office in 3 weeks for postop visit   [facility] Surgery CenterFERNANDEZ,Laura Stallings HMD12:21 PMTD@  N

## 2015-09-02 NOTE — Patient Instructions (Signed)
Conization of the Cervix  Cervical conization is the cutting (excision) of a cone-shaped portion of the cervix. The procedure is performed through the vagina in either your health care provider's office or an operating room. This procedure is usually done when there is abnormal bleeding from the cervix. It can also be done to evaluate an abnormal Pap test or if an abnormality is seen on the cervix during an exam. The tissue is then examined to see if there are precancerous cells or cancer present.   Conization of the cervix is not done during a menstrual period or pregnancy.   LET YOUR HEALTH CARE PROVIDER KNOW ABOUT:  · Any allergies you have.    · All medicines you are taking, including vitamins, herbs, eye drops, creams, and over-the-counter medicines.    · Previous problems you or members of your family have had with the use of anesthetics.    · Any blood disorders you have.    · Previous surgeries you have had.    · Medical conditions you have.    · Your smoking habits.    · The possibility of being pregnant.    RISKS AND COMPLICATIONS   Generally, conization of the cervix is a safe procedure. However, as with any procedure, complications can occur. Possible complications include:  · Heavy bleeding several days or weeks after the procedure. Light bleeding or spotting after the procedure is normal.  · Infection (rare).  · Damage to the cervix or surrounding organs (uncommon).    · Problems with the anesthesia.    · Increased risk of preterm labor in future pregnancies.  BEFORE THE PROCEDURE  · Do not eat or drink anything for 6-8 hours before the procedure.    · Do not take aspirin or blood thinners for at least a week before the procedure or as directed by your health care provider.    · Arrange for someone to take you home after the procedure.    PROCEDURE  There are three different methods to perform conization of the cervix. These include:   · The cold knife method. In this method a small cone-shaped sample  of tissue is cut out with a knife (scalpel) from the cervical canal and the transformation zone (where the normal cells end and the abnormal cells begin).    · The LEEP method. In this method a small cone-shaped sample of tissue is cut out with a thin wire that can burn (cauterize) the cervical tissue with an electrical current.    · Laser treatment. In this method a small cone-shaped sample of tissue is cut out and then cauterized with a laser beam to prevent bleeding.    The procedure will be performed as follows:   · Depending on the method, you will either be given a medicine to make you sleep (general anesthetic) or a numbing medicine (local anesthetic). A medicine that numbs the cervix (cervical block) may be given.    · A lubricated device called a speculum will be inserted into the vagina to spread open the walls of the vagina. This will help your health care provider see the inside of the vagina and cervix better.    · The tissue from the cervix will be removed and examined.    · The results of the procedure will help your health care provider decide if further treatment is necessary. They will also help your health care provider decide on the best treatment if your results are abnormal.  AFTER THE PROCEDURE  · If you had a general anesthetic, you may be groggy for 2-3 hours after   the procedure.    · If you had a local anesthetic, you will rest at the clinic or hospital until you are stable and feel ready to go home.    · Recovery may take up to 3 weeks.    · You may have some cramping for about 1 week.    · You may have bloody discharge or light bleeding for 1-2 weeks.    · You may have black discharge coming from the vagina. This is from the paste used on the cervix to prevent bleeding. This is normal discharge.       This information is not intended to replace advice given to you by your health care provider. Make sure you discuss any questions you have with your health care provider.     Document  Released: 12/22/2004 Document Revised: 03/19/2013 Document Reviewed: 09/07/2012  Elsevier Interactive Patient Education ©2016 [employer]  Loop Electrosurgical Excision Procedure, Care After  Refer to this sheet in the next few weeks. These instructions provide you with information on caring for yourself after your procedure. Your caregiver may also give you more specific instructions. Your treatment has been planned according to current medical practices, but problems sometimes occur. Call your caregiver if you have any problems or questions after your procedure.  HOME CARE INSTRUCTIONS   · Do not use tampons, douche, or have sexual intercourse for 2 weeks or as directed by your caregiver.  · Begin normal activities if you have no or minimal cramping or bleeding, unless directed otherwise by your caregiver.  · Take your temperature if you feel sick. Write down your temperature on paper, and tell your caregiver if you have a fever.  · Take all medicines as directed by your caregiver.  · Keep all your follow-up appointments and Pap tests as directed by your caregiver.  SEEK IMMEDIATE MEDICAL CARE IF:   · You have bleeding that is heavier or longer than a normal menstrual cycle.  · You have bleeding that is bright red.  · You have blood clots.  · You have a fever.  · You have increasing cramps or pain not relieved by medicine.  · You develop abdominal pain that does not seem to be related to the same area of earlier cramping and pain.  · You are lightheaded, unusually weak, or faint.  · You develop painful or bloody urination.  · You develop a bad smelling vaginal discharge.  MAKE SURE YOU:  · Understand these instructions.  · Will watch your condition.  · Will get help right away if you are not doing well or get worse.     This information is not intended to replace advice given to you by your health care provider. Make sure you discuss any questions you have with your health care provider.     Document Released:  11/25/2010 Document Revised: 04/04/2014 Document Reviewed: 11/25/2010  Elsevier Interactive Patient Education ©2016 [employer] Inc[employer]

## 2015-09-02 NOTE — Addendum Note (Signed)
Addended by: Dayna BarkerGARDNER, Kaleel Schmieder K on: 09/02/2015 12:28 PM   Modules accepted: Orders

## 2015-09-21 ENCOUNTER — Ambulatory Visit: Payer: 59 | Admitting: Gynecology

## 2015-09-24 ENCOUNTER — Ambulatory Visit (INDEPENDENT_AMBULATORY_CARE_PROVIDER_SITE_OTHER): Payer: 59 | Admitting: Gynecology

## 2015-09-24 ENCOUNTER — Encounter: Payer: Self-pay | Admitting: Gynecology

## 2015-09-24 VITALS — BP 130/80

## 2015-09-24 DIAGNOSIS — D069 Carcinoma in situ of cervix, unspecified: Secondary | ICD-10-CM

## 2015-09-24 DIAGNOSIS — Z09 Encounter for follow-up examination after completed treatment for conditions other than malignant neoplasm: Secondary | ICD-10-CM

## 2015-09-24 NOTE — Progress Notes (Addendum)
   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 See previous note June 7 for detail COLPOSCOPIC EVALUATION AND BIOPSY. 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 - LOW GRADE SQUAMOUS INTRAEPITHELIAL LESION, CIN-I (MILD DYSPLASIA). - ENDOCERVICAL AND ECTOCERVICAL RESECTION MARGINS ARE NEGATIVE FOR DYSPLASIA.  Patient is otherwise doing well with no complaints.   ROS: A ROS was performed and pertinent positives and negatives are included in the history.  GENERAL: No fevers or chills. HEENT: No change in vision, no earache, sore throat or sinus congestion. NECK: No pain or stiffness. CARDIOVASCULAR: No chest pain or pressure. No palpitations. PULMONARY: No shortness of breath, cough or wheeze. GASTROINTESTINAL: No abdominal pain, nausea, vomiting or diarrhea, melena or bright red blood per rectum. GENITOURINARY: No urinary frequency, urgency, hesitancy or dysuria. MUSCULOSKELETAL: No joint or muscle pain, no back pain, no recent trauma. DERMATOLOGIC: No rash, no itching, no lesions. ENDOCRINE: No polyuria, polydipsia, no heat or cold intolerance. No recent change in weight. HEMATOLOGICAL: No anemia or easy bruising or bleeding. NEUROLOGIC: No headache, seizures, numbness, tingling or weakness. PSYCHIATRIC: No depression, no loss of interest in normal activity or change in sleep pattern.   PE: Blood pressure 130/80 Gen. appearance well-developed: Nourished female with no complaints Pelvic exam: Bartholin urethra Skene was within normal limits Vagina: No lesions or discharge Cervix: Cervical bed side of LEEP cervical conization healing nicely. 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 Results shared with the patient. Will continue to monitor closely with Pap smear yearly. I would recommend a colposcopy and Pap smear in 12 months. She'll return in 3 weeks for final postop visit. She is currently using the Nexplanon has a form of contraception but she'll refrain from intercourse until her final postop visit in 3 weeks.

## 2015-10-12 ENCOUNTER — Encounter: Payer: Self-pay | Admitting: Gynecology

## 2015-10-12 ENCOUNTER — Ambulatory Visit (INDEPENDENT_AMBULATORY_CARE_PROVIDER_SITE_OTHER): Payer: 59 | Admitting: Gynecology

## 2015-10-12 VITALS — BP 120/78

## 2015-10-12 DIAGNOSIS — D069 Carcinoma in situ of cervix, unspecified: Secondary | ICD-10-CM | POA: Insufficient documentation

## 2015-10-12 DIAGNOSIS — Z09 Encounter for follow-up examination after completed treatment for conditions other than malignant neoplasm: Secondary | ICD-10-CM

## 2015-10-12 NOTE — Progress Notes (Signed)
   Patient is a 30 year old who presented to the office for her six-week postop visit. 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 See previous note June 7 for detail COLPOSCOPIC EVALUATION AND BIOPSY. 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 - LOW GRADE SQUAMOUS INTRAEPITHELIAL LESION, CIN-I (MILD DYSPLASIA). - ENDOCERVICAL AND ECTOCERVICAL RESECTION MARGINS ARE NEGATIVE FOR DYSPLASIA.  Patient is otherwise doing well with no complaints.  Physical exam: Gen. appearance well-developed: Nourished female with no complaints Pelvic exam: Bartholin urethra Skene was within normal limits Vagina: No lesions or discharge Cervix: Cervical bed side of LEEP cervical conization healing nicely. 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 Results shared with the patient. Will continue to monitor closely with Pap smear yearly. I would recommend a colposcopy and Pap smear in 12 months. She is currently using Nexplanon for contraception.

## 2015-10-13 ENCOUNTER — Ambulatory Visit: Payer: 59 | Admitting: Gynecology

## 2016-08-10 ENCOUNTER — Encounter: Payer: Self-pay | Admitting: Gynecology

## 2016-10-18 ENCOUNTER — Ambulatory Visit (INDEPENDENT_AMBULATORY_CARE_PROVIDER_SITE_OTHER): Payer: 59 | Admitting: Gynecology

## 2016-10-18 ENCOUNTER — Encounter: Payer: Self-pay | Admitting: Gynecology

## 2016-10-18 VITALS — BP 122/78 | Ht 64.0 in | Wt 143.0 lb

## 2016-10-18 DIAGNOSIS — Z113 Encounter for screening for infections with a predominantly sexual mode of transmission: Secondary | ICD-10-CM | POA: Diagnosis not present

## 2016-10-18 DIAGNOSIS — Z87410 Personal history of cervical dysplasia: Secondary | ICD-10-CM | POA: Diagnosis not present

## 2016-10-18 DIAGNOSIS — Z01419 Encounter for gynecological examination (general) (routine) without abnormal findings: Secondary | ICD-10-CM | POA: Diagnosis not present

## 2016-10-18 NOTE — Progress Notes (Addendum)
Jake BatheDenetra M CLEGG 1985-09-09 [id number]   History:    31 y.o.  for annual gyn exam requesting an STD screen because of concerns of partner infidelity. She is otherwise asymptomatic. 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 - LOW GRADE SQUAMOUS INTRAEPITHELIAL LESION, CIN-I (MILD DYSPLASIA). - ENDOCERVICAL AND ECTOCERVICAL RESECTION MARGINS ARE NEGATIVE FOR DYSPLASIA  Patient had a Nexplanon changed in 2017   Past medical history,surgical history, family history and social history were all reviewed and documented in the EPIC chart.  Gynecologic History Patient's last menstrual period was 09/27/2016. Contraception: Nexplanon Last Papsee above. Results were: see above  Last mammogram  not indicated. Results were: not indicated   Obstetric History OB History  Gravida Para Term Preterm AB Living  2 2 2     2   SAB TAB Ectopic Multiple Live Births          1    # Outcome Date GA Lbr Len/2nd Weight Sex Delivery Anes PTL Lv  2 Term 01/30/12 [id number] 03:31 / 01:29 6 lb 13.5 oz (3.105 kg) F Vag-Spont EPI  LIV  1 Term                ROS: A ROS was performed and pertinent positives and negatives are included in the history.  GENERAL: No fevers or chills. HEENT: No change in vision, no earache, sore throat or sinus congestion. NECK: No pain or stiffness. CARDIOVASCULAR: No chest pain or pressure. No palpitations. PULMONARY: No shortness of breath, cough or wheeze. GASTROINTESTINAL: No abdominal pain, nausea, vomiting or diarrhea, melena or bright red blood per rectum. GENITOURINARY: No urinary frequency,  urgency, hesitancy or dysuria. MUSCULOSKELETAL: No joint or muscle pain, no back pain, no recent trauma. DERMATOLOGIC: No rash, no itching, no lesions. ENDOCRINE: No polyuria, polydipsia, no heat or cold intolerance. No recent change in weight. HEMATOLOGICAL: No anemia or easy bruising or bleeding. NEUROLOGIC: No headache, seizures, numbness, tingling or weakness. PSYCHIATRIC: No depression, no loss of interest in normal activity or change in sleep pattern.     Exam: chaperone present  BP 122/78   Ht 5\' 4"  (1.626 m)   Wt 143 lb (64.9 kg)   LMP 09/27/2016   BMI 24.55 kg/m   Body mass index is 24.55 kg/m.  General appearance : Well developed well nourished female. No acute distress HEENT: Eyes: no retinal hemorrhage or exudates,  Neck supple, trachea midline, no carotid bruits, no thyroidmegaly Lungs: Clear to auscultation, no rhonchi or wheezes, or rib retractions  Heart: Regular rate and rhythm, no murmurs or gallops Breast:Examined in sitting and supine position were symmetrical in appearance, no palpable masses or tenderness,  no skin retraction, no nipple inversion, no nipple discharge, no skin discoloration, no axillary or supraclavicular lymphadenopathy Abdomen: no palpable masses or tenderness, no rebound or guarding Extremities: no edema or skin discoloration or tenderness  Nexplanon left upper medial arm  Pelvic:  Bartholin, Urethra, Skene Glands: Within normal limits             Vagina: No gross lesions or discharge  Cervix: No gross lesions or discharge  UteAnteverted, normal size, shape and consistency, non-tender and mobile  Adnexa  Without masses or tenderness  Anus and perineum  normal   Rectovaginal  normal  sphincter tone without palpated masses or tenderness             HemNot indicated     Assessment/Plan:  31 y.o. female for annual exahad a full STD screen as per patient's request. GC and Chlamydia culture along with HIV RPR hepatitis B and C will be obtained  today. The following screening blood work was ordered also CBC, screening cholesterol, comprehensive metabolic panel urinalysis and TSH because of patient's weight loss. Patient with past history of CIN-2-CIN-3 had a Pap smear today and recommend have one yearly.  Ok Edwards MD, 2:26 PM 10/18/2016  Requesting an STD because of concern of partner's infidelity

## 2016-10-18 NOTE — Addendum Note (Signed)
Addended by: Kem ParkinsonBARNES, Tydus Sanmiguel on: 10/18/2016 02:32 PM   Modules accepted: Orders

## 2016-10-19 LAB — URINALYSIS W MICROSCOPIC + REFLEX CULTURE
BILIRUBIN URINE: NEGATIVE
Bacteria, UA: NONE SEEN [HPF]
Casts: NONE SEEN [LPF]
Crystals: NONE SEEN [HPF]
GLUCOSE, UA: NEGATIVE
HGB URINE DIPSTICK: NEGATIVE
KETONES UR: NEGATIVE
LEUKOCYTES UA: NEGATIVE
NITRITE: NEGATIVE
PH: 6.5 (ref 5.0–8.0)
Protein, ur: NEGATIVE
RBC / HPF: NONE SEEN RBC/HPF (ref <=2)
SPECIFIC GRAVITY, URINE: 1.021 (ref 1.001–1.035)
Yeast: NONE SEEN [HPF]

## 2016-10-19 LAB — GC/CHLAMYDIA PROBE AMP
CT PROBE, AMP APTIMA: NOT DETECTED
GC Probe RNA: NOT DETECTED

## 2016-10-20 LAB — PAP, TP IMAGING W/ HPV RNA, RFLX HPV TYPE 16,18/45: HPV mRNA, High Risk: NOT DETECTED

## 2016-10-20 LAB — URINE CULTURE

## 2016-11-21 DIAGNOSIS — R200 Anesthesia of skin: Secondary | ICD-10-CM | POA: Diagnosis not present

## 2017-05-10 DIAGNOSIS — G5603 Carpal tunnel syndrome, bilateral upper limbs: Secondary | ICD-10-CM | POA: Diagnosis not present

## 2017-07-17 ENCOUNTER — Ambulatory Visit (INDEPENDENT_AMBULATORY_CARE_PROVIDER_SITE_OTHER): Payer: 59 | Admitting: Neurology

## 2017-07-17 ENCOUNTER — Encounter: Payer: Self-pay | Admitting: Neurology

## 2017-07-17 VITALS — BP 126/83 | HR 74 | Ht 64.0 in | Wt 160.0 lb

## 2017-07-17 DIAGNOSIS — R202 Paresthesia of skin: Secondary | ICD-10-CM | POA: Diagnosis not present

## 2017-07-17 NOTE — Progress Notes (Signed)
Reason for visit: Paresthesias of hands  Referring physician: Dr. Jae Dire is a 32 y.o. female  History of present illness:  Laura Osborne is a 32 year old right-handed black female with a history of numbness and paresthesias affecting both hands off and on for 17 years.  Over time, this has become more frequent and prominent.  The patient oftentimes wake up at night with numbness, the nighttime is the worst time for her symptoms.  She is wearing wrist splints bilaterally but the symptoms are slowly getting worse.  The patient denies any neck pain or pain down the arms.  She denies any weakness of the hands or arms.  She denies any numbness of the feet.  She denies any balance issues or difficulty controlling the bowels or the bladder.  She denies any other symptoms such as headaches or dizziness.  She is sent into the office today for evaluation of possible carpal tunnel syndrome.  The patient does have a sensation of swelling in the hands in the morning.  Past Medical History:  Diagnosis Date  . No pertinent past medical history     Past Surgical History:  Procedure Laterality Date  . NO PAST SURGERIES      Family History  Problem Relation Age of Onset  . Hypertension Maternal Grandmother   . Hypertension Maternal Grandfather   . Diabetes Maternal Grandfather   . Heart disease Maternal Grandfather   . Heart attack Father     Social history:  reports that she has never smoked. She has never used smokeless tobacco. She reports that she drinks alcohol. She reports that she does not use drugs.  Medications:  Prior to Admission medications   Medication Sig Start Date End Date Taking? Authorizing Provider  Echinacea 80 MG CAPS Take by mouth.   Yes [provider]  Prenatal Vit-Fe Fumarate-FA (PRENATAL MULTIVITAMIN) TABS Take 1 tablet by mouth daily. Reported on 04/22/2015   Yes [provider]  b complex vitamins tablet Take 1 tablet by mouth  daily.    [provider]     No Known Allergies  ROS:  Out of a complete 14 system review of symptoms, the patient complains only of the following symptoms, and all other reviewed systems are negative.  Joint pain, joint swelling, aching muscles Numbness, weakness Insomnia  Blood pressure 126/83, pulse 74, height 5\' 4"  (1.626 m), weight 160 lb (72.6 kg).  Physical Exam  General: The patient is alert and cooperative at the time of the examination.  Eyes: Pupils are equal, round, and reactive to light. Discs are flat bilaterally.  Neck: The neck is supple, no carotid bruits are noted.  Respiratory: The respiratory examination is clear.  Cardiovascular: The cardiovascular examination reveals a regular rate and rhythm, no obvious murmurs or rubs are noted.  Neuromuscular: Range of movement the neck is full.  Skin: Extremities are without significant edema.  Neurologic Exam  Mental status: The patient is alert and oriented x 3 at the time of the examination. The patient has apparent normal recent and remote memory, with an apparently normal attention span and concentration ability.  Cranial nerves: Facial symmetry is present. There is good sensation of the face to pinprick and soft touch bilaterally. The strength of the facial muscles and the muscles to head turning and shoulder shrug are normal bilaterally. Speech is well enunciated, no aphasia or dysarthria is noted. Extraocular movements are full. Visual fields are full. The tongue is midline, and  the patient has symmetric elevation of the soft palate. No obvious hearing deficits are noted.  Motor: The motor testing reveals 5 over 5 strength of all 4 extremities. Good symmetric motor tone is noted throughout.  Sensory: Sensory testing is intact to pinprick, soft touch, vibration sensation, and position sense on all 4 extremities. No evidence of extinction is noted.  Coordination: Cerebellar testing reveals good  finger-nose-finger and heel-to-shin bilaterally.  Tinel sign of the wrists is negative bilaterally.  Gait and station: Gait is normal. Tandem gait is normal. Romberg is negative. No drift is seen.  Reflexes: Deep tendon reflexes are symmetric and normal bilaterally. Toes are downgoing bilaterally.   Assessment/Plan:  1.  Bilateral hand numbness, possible carpal tunnel syndrome  The patient will be set up for nerve conduction studies on both arms, and EMG on one arm.  The patient may have very mild carpal tunnel syndrome.  Laura Osborne. Keith Willis MD 07/17/2017 9:49 AM  [facility] [address] [place], [place] [id number]  Phone [phone] Fax [phone]

## 2017-08-14 ENCOUNTER — Encounter: Payer: Self-pay | Admitting: Neurology

## 2017-08-14 ENCOUNTER — Ambulatory Visit (INDEPENDENT_AMBULATORY_CARE_PROVIDER_SITE_OTHER): Payer: 59 | Admitting: Neurology

## 2017-08-14 DIAGNOSIS — R202 Paresthesia of skin: Secondary | ICD-10-CM

## 2017-08-14 DIAGNOSIS — G5603 Carpal tunnel syndrome, bilateral upper limbs: Secondary | ICD-10-CM

## 2017-08-14 DIAGNOSIS — G5601 Carpal tunnel syndrome, right upper limb: Secondary | ICD-10-CM | POA: Insufficient documentation

## 2017-08-14 HISTORY — DX: Carpal tunnel syndrome, bilateral upper limbs: G56.03

## 2017-08-14 NOTE — Progress Notes (Addendum)
The patient comes in today for EMG nerve conduction study that shows evidence of mild bilateral carpal tunnel syndrome.  The patient has been using wrist splints for several years without benefit, we will make a referral to a hand surgeon.    MNC    Nerve / Sites Muscle Latency Ref. Amplitude Ref. Rel Amp Segments Distance Velocity Ref. Area    ms ms mV mV %  cm m/s m/s mVms  L Median - APB     Wrist APB 5.0 ?4.4 8.5 ?4.0 100 Wrist - APB 7   26.7     Upper arm APB 8.9  8.1  95.3 Upper arm - Wrist 22 57 ?49 25.4  R Median - APB     Wrist APB 5.2 ?4.4 6.8 ?4.0 100 Wrist - APB 7   19.8     Upper arm APB 8.7  6.5  96.6 Upper arm - Wrist 21 59 ?49 19.0  L Ulnar - ADM     Wrist ADM 2.3 ?3.3 9.4 ?6.0 100 Wrist - ADM 7   29.6     B.Elbow ADM 5.3  9.2  97.4 B.Elbow - Wrist 17 56 ?49 29.9     A.Elbow ADM 7.2  9.1  98.7 A.Elbow - B.Elbow 10 52 ?49 29.7         A.Elbow - Wrist      R Ulnar - ADM     Wrist ADM 2.4 ?3.3 6.4 ?6.0 100 Wrist - ADM 7   22.7     B.Elbow ADM 5.8  6.0  94.8 B.Elbow - Wrist 18 53 ?49 22.4     A.Elbow ADM 7.6  6.1  101 A.Elbow - B.Elbow 10 55 ?49 22.7         A.Elbow - Wrist                 SNC    Nerve / Sites Rec. Site Peak Lat Ref.  Amp Ref. Segments Distance    ms ms V V  cm  L Median - Orthodromic (Dig II, Mid palm)     Dig II Wrist 4.0 ?3.4 7 ?10 Dig II - Wrist 13  R Median - Orthodromic (Dig II, Mid palm)     Dig II Wrist 4.4 ?3.4 2 ?10 Dig II - Wrist 13  L Ulnar - Orthodromic, (Dig V, Mid palm)     Dig V Wrist 2.3 ?3.1 9 ?5 Dig V - Wrist 11  R Ulnar - Orthodromic, (Dig V, Mid palm)     Dig V Wrist 2.3 ?3.1 11 ?5 Dig V - Wrist 72             F  Wave    Nerve F Lat Ref.   ms ms  L Ulnar - ADM 25.0 ?32.0  R Ulnar - ADM 24.9 ?32.0         EMG full

## 2017-08-14 NOTE — Procedures (Signed)
     HISTORY:  Laura Osborne is a 32 year old patient with a history of bilateral hand numbness, not improving with wrist splints.  The patient comes in today for evaluation of a possible neuropathy or a cervical radiculopathy.  NERVE CONDUCTION STUDIES:  Nerve conduction studies were performed on both upper extremities.  The distal motor latencies for the median nerves were prolonged bilaterally with normal motor amplitudes for these nerves bilaterally.  The distal motor latencies and motor amplitudes for the ulnar nerves were normal bilaterally.  The nerve conduction velocities for the median and ulnar nerves were normal bilaterally.  The sensory latencies for the median nerves were prolonged bilaterally, normal for the ulnar nerves bilaterally.  The F-wave latencies for the ulnar nerves were within normal limits bilaterally.  EMG STUDIES:  EMG study was performed on the right upper extremity:  The first dorsal interosseous muscle reveals 2 to 4 K units with full recruitment. No fibrillations or positive waves were noted. The abductor pollicis brevis muscle reveals 2 to 4 K units with full recruitment. No fibrillations or positive waves were noted. The extensor indicis proprius muscle reveals 1 to 3 K units with full recruitment. No fibrillations or positive waves were noted. The pronator teres muscle reveals 2 to 3 K units with full recruitment. No fibrillations or positive waves were noted. The biceps muscle reveals 1 to 2 K units with full recruitment. No fibrillations or positive waves were noted. The triceps muscle reveals 2 to 4 K units with full recruitment. No fibrillations or positive waves were noted. The anterior deltoid muscle reveals 2 to 3 K units with full recruitment. No fibrillations or positive waves were noted. The cervical paraspinal muscles were tested at 2 levels. No abnormalities of insertional activity were seen at either level tested. There was poor  relaxation.   IMPRESSION:  Nerve conduction studies done on both upper extremities shows evidence of mild bilateral carpal tunnel syndrome.  EMG evaluation of the right upper extremity was unremarkable, without evidence of an overlying cervical radiculopathy.  Marlan Palau MD 08/14/2017 9:13 AM  [facility] [address] [place], [place] [address]  Phone [phone] Fax [phone]

## 2017-08-14 NOTE — Progress Notes (Signed)
Please refer to EMG and nerve conduction study procedure note. 

## 2017-11-30 ENCOUNTER — Encounter: Payer: 59 | Admitting: Gynecology

## 2017-12-05 ENCOUNTER — Ambulatory Visit (INDEPENDENT_AMBULATORY_CARE_PROVIDER_SITE_OTHER): Payer: 59 | Admitting: Gynecology

## 2017-12-05 ENCOUNTER — Encounter: Payer: Self-pay | Admitting: Gynecology

## 2017-12-05 VITALS — BP 124/84 | Ht 64.0 in | Wt 154.0 lb

## 2017-12-05 DIAGNOSIS — Z308 Encounter for other contraceptive management: Secondary | ICD-10-CM | POA: Diagnosis not present

## 2017-12-05 DIAGNOSIS — Z01419 Encounter for gynecological examination (general) (routine) without abnormal findings: Secondary | ICD-10-CM | POA: Diagnosis not present

## 2017-12-05 DIAGNOSIS — Z13220 Encounter for screening for lipoid disorders: Secondary | ICD-10-CM

## 2017-12-05 NOTE — Patient Instructions (Signed)
Follow-up in February 2020 to have the Nexplanon replaced or an IUD placed

## 2017-12-05 NOTE — Progress Notes (Signed)
    Laura Osborne 1985/04/03 [id number]        32 y.o.  [id number] for annual gynecologic exam.  Former patient of Dr. Lily Osborne.  Without gynecologic complaints.  Several issues noted below.  Past medical history,surgical history, problem list, medications, allergies, family history and social history were all reviewed and documented as reviewed in the EPIC chart.  ROS:  Performed with pertinent positives and negatives included in the history, assessment and plan.   Additional significant findings : None   Exam: Laura Osborne assistant Vitals:   12/05/17 1004  BP: 124/84  Weight: 154 lb (69.9 kg)  Height: 5\' 4"  (1.626 m)   Body mass index is 26.43 kg/m.  General appearance:  Normal affect, orientation and appearance. Skin: Grossly normal HEENT: Without gross lesions.  No cervical or supraclavicular adenopathy. Thyroid normal.  Lungs:  Clear without wheezing, rales or rhonchi Cardiac: RR, without RMG Abdominal:  Soft, nontender, without masses, guarding, rebound, organomegaly or hernia Breasts:  Examined lying and sitting without masses, retractions, discharge or axillary adenopathy. Pelvic:  Ext, BUS, Vagina: Normal  Cervix: With LEEP changes.  Pap smear done  Uterus: Axial, normal size, shape and contour, midline and mobile nontender   Adnexa: Without masses or tenderness    Anus and perineum: Normal   Rectovaginal: Normal sphincter tone without palpated masses or tenderness.    Assessment/Plan:  32 y.o. G78P2002 female for annual gynecologic exam with irregular menses, Nexplanon contraception.   1. Nexplanon 04/2015.  Due to be replaced this coming February and I reminded her of this.  We discussed about alternatives to include Mirena IUD.  Advantages as far as good for 5 years and menstrual suppression reviewed.  Patient is going to decide what she wants to do but regardless will follow-up in February for Nexplanon removal. 2. 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  Pap smear/HPV last year negative.  Pap smear done today. 3. Breast exam normal today.  No strong family history of breast cancer.  Will plan screening mammography beginning at age 21. 4. Health maintenance.  Requests baseline labs.  CBC, CMP and lipid profile ordered.  Had STD screening last year but has been abstinent since and declines STD screening.  Follow-up February for Nexplanon removal possible replacement or IUD.  Follow-up in 1 year for annual exam.   Laura Lords MD, 10:26 AM 12/05/2017

## 2017-12-05 NOTE — Addendum Note (Signed)
Addended by: Berna Spare A on: 12/05/2017 11:47 AM   Modules accepted: Orders

## 2017-12-06 LAB — CBC WITH DIFFERENTIAL/PLATELET
BASOS PCT: 0.5 %
Basophils Absolute: 20 cells/uL (ref 0–200)
EOS PCT: 2.1 %
Eosinophils Absolute: 82 cells/uL (ref 15–500)
HEMATOCRIT: 36.9 % (ref 35.0–45.0)
Hemoglobin: 12.1 g/dL (ref 11.7–15.5)
LYMPHS ABS: 1147 {cells}/uL (ref 850–3900)
MCH: 27.6 pg (ref 27.0–33.0)
MCHC: 32.8 g/dL (ref 32.0–36.0)
MCV: 84.1 fL (ref 80.0–100.0)
MPV: 10.3 fL (ref 7.5–12.5)
Monocytes Relative: 9.3 %
Neutro Abs: 2289 cells/uL (ref 1500–7800)
Neutrophils Relative %: 58.7 %
PLATELETS: 279 10*3/uL (ref 140–400)
RBC: 4.39 10*6/uL (ref 3.80–5.10)
RDW: 12.9 % (ref 11.0–15.0)
Total Lymphocyte: 29.4 %
WBC: 3.9 10*3/uL (ref 3.8–10.8)
WBCMIX: 363 {cells}/uL (ref 200–950)

## 2017-12-06 LAB — COMPREHENSIVE METABOLIC PANEL
AG Ratio: 1.2 (calc) (ref 1.0–2.5)
ALT: 8 U/L (ref 6–29)
AST: 12 U/L (ref 10–30)
Albumin: 4.3 g/dL (ref 3.6–5.1)
Alkaline phosphatase (APISO): 43 U/L (ref 33–115)
BILIRUBIN TOTAL: 0.3 mg/dL (ref 0.2–1.2)
BUN: 7 mg/dL (ref 7–25)
CALCIUM: 9.3 mg/dL (ref 8.6–10.2)
CO2: 24 mmol/L (ref 20–32)
Chloride: 103 mmol/L (ref 98–110)
Creat: 0.76 mg/dL (ref 0.50–1.10)
Globulin: 3.6 g/dL (calc) (ref 1.9–3.7)
Glucose, Bld: 77 mg/dL (ref 65–99)
Potassium: 4.1 mmol/L (ref 3.5–5.3)
SODIUM: 137 mmol/L (ref 135–146)
TOTAL PROTEIN: 7.9 g/dL (ref 6.1–8.1)

## 2017-12-06 LAB — PAP IG W/ RFLX HPV ASCU

## 2017-12-06 LAB — LIPID PANEL
Cholesterol: 160 mg/dL (ref <200)
HDL: 45 mg/dL — AB (ref >50)
LDL CHOLESTEROL (CALC): 98 mg/dL
Non-HDL Cholesterol (Calc): 115 mg/dL (calc) (ref <130)
TRIGLYCERIDES: 81 mg/dL (ref <150)
Total CHOL/HDL Ratio: 3.6 (calc) (ref <5.0)

## 2018-02-14 DIAGNOSIS — R590 Localized enlarged lymph nodes: Secondary | ICD-10-CM | POA: Diagnosis not present

## 2018-02-23 DIAGNOSIS — R070 Pain in throat: Secondary | ICD-10-CM | POA: Diagnosis not present

## 2018-02-23 DIAGNOSIS — R590 Localized enlarged lymph nodes: Secondary | ICD-10-CM | POA: Diagnosis not present

## 2018-02-23 DIAGNOSIS — R509 Fever, unspecified: Secondary | ICD-10-CM | POA: Diagnosis not present

## 2018-04-09 DIAGNOSIS — E038 Other specified hypothyroidism: Secondary | ICD-10-CM | POA: Diagnosis not present

## 2018-05-02 ENCOUNTER — Encounter: Payer: Self-pay | Admitting: Gynecology

## 2018-05-02 ENCOUNTER — Ambulatory Visit (INDEPENDENT_AMBULATORY_CARE_PROVIDER_SITE_OTHER): Payer: 59 | Admitting: Gynecology

## 2018-05-02 VITALS — BP 114/76

## 2018-05-02 DIAGNOSIS — Z3046 Encounter for surveillance of implantable subdermal contraceptive: Secondary | ICD-10-CM | POA: Diagnosis not present

## 2018-05-02 NOTE — Patient Instructions (Signed)
Follow-up if any issues with the Nexplanon removal site healing.  Otherwise follow-up when you are due for your next annual exam.  Follow-up sooner if contraceptive discussion is needed.

## 2018-05-02 NOTE — Progress Notes (Signed)
    Laura Osborne 11-22-1985 [id number]        33 y.o.  G2P2002 presents to have her Nexplanon removed.  Currently not sexually active and plans abstinent contraception for now.  Past medical history,surgical history, problem list, medications, allergies, family history and social history were all reviewed and documented in the EPIC chart.  Directed ROS with pertinent positives and negatives documented in the history of present illness/assessment and plan.  Exam: [name] assistant Vitals:   05/02/18 1552  BP: 114/76   General appearance:  Normal  Procedure:  The removal process was reviewed with the patient and the potential risks were discussed to include bleeding, infection, damage to underlying neurovascular structures and the possibility that we may not be able to remove the rod or incomplete removal.  The left upper, inner arm was examined with the Nexplanon rod clearly palpated. The skin overlying the distal portion of the rod was cleansed with Betadine and infiltrated with 1% lidocaine. A small skin incision was made with a scalpel over the distal tip of the Nexplanon rod at the scar site of insertion. The Nexplanon tip was subsequently delivered through the incision using blunt and sharp dissection and the tip was grasped with a forcep and the Nexplanon rod was removed intact, shown to the patient and discarded.  A Steri-Strip was used to close the small skin defect and a pressure dressing was applied with postoperative instructions given.  Patient plans abstinent contraception for now.  She will follow-up for contraceptive discussion if she chooses to become sexually active.   Dara Lords MD, 4:12 PM 05/02/2018

## 2018-05-04 ENCOUNTER — Encounter: Payer: Self-pay | Admitting: Gynecology

## 2018-05-04 ENCOUNTER — Telehealth: Payer: Self-pay

## 2018-05-04 ENCOUNTER — Ambulatory Visit (INDEPENDENT_AMBULATORY_CARE_PROVIDER_SITE_OTHER): Payer: 59 | Admitting: Gynecology

## 2018-05-04 VITALS — BP 118/78

## 2018-05-04 DIAGNOSIS — T148XXA Other injury of unspecified body region, initial encounter: Secondary | ICD-10-CM

## 2018-05-04 NOTE — Patient Instructions (Signed)
Follow-up if there are any issues with the Nexplanon excision site

## 2018-05-04 NOTE — Progress Notes (Signed)
    Laura Osborne 08-17-1985 [id number]        33 y.o.  [id number] presents having noticed a blister around the site of where her Nexplanon was removed 2 days ago.  No other complaints.  No significant pain or drainage.  Past medical history,surgical history, problem list, medications, allergies, family history and social history were all reviewed and documented in the EPIC chart.  Directed ROS with pertinent positives and negatives documented in the history of present illness/assessment and plan.  Exam: Vitals:   05/04/18 1406  BP: 118/78   General appearance:  Normal Left upper inner arm examined with Band-Aid across the Nexplanon excision site.  Outside of this area she has a small blisterlike bleb.  No surrounding erythema.  No bruising at the Nexplanon rod tract site.  No tenderness on palpation.  Assessment/Plan:  33 y.o. G2P2002 with small blister outside of the area where the Nexplanon rod was excised.  No evidence of infection or rash.  Possible traumatic from pushing to deliver the rod through the incision site.  Patient is going to monitor as I expect this will spontaneously resolve.  Follow-up if any new findings develop.    Dara Lords MD, 2:16 PM 05/04/2018

## 2018-05-04 NOTE — Telephone Encounter (Signed)
Patient had Nexplanon removed on 05/02/18.  She called today because she said there is a small blister near the site.  She said it is as the opposite end of where the incision was made at the site where the Nexplanon was.  No pain, no redness, no fever. About the size of a bead/pearl.  What to recommend?

## 2018-05-04 NOTE — Telephone Encounter (Signed)
Possible reaction to the tape or Betadine?  Really unsure unless I see it but as long as the Nexplanon rod is clearly under the skin and the skin incision where was placed is okay I would watch it and I suspect it will resolve.  If she has any question at all she can run in and I can take a look at it.

## 2018-05-04 NOTE — Telephone Encounter (Addendum)
Just to clarify this was a removal. No Nexplanon is in place at this time.  Patient did want to come by for you to take a look. She will be here for 2:00pm appt.

## 2018-09-05 ENCOUNTER — Telehealth: Payer: Self-pay | Admitting: *Deleted

## 2018-09-05 NOTE — Telephone Encounter (Signed)
Unable to reach pt. Pt need to sign a release form.

## 2018-12-12 ENCOUNTER — Other Ambulatory Visit: Payer: Self-pay

## 2018-12-12 ENCOUNTER — Encounter: Payer: Self-pay | Admitting: Gynecology

## 2018-12-12 ENCOUNTER — Ambulatory Visit (INDEPENDENT_AMBULATORY_CARE_PROVIDER_SITE_OTHER): Payer: 59 | Admitting: Gynecology

## 2018-12-12 VITALS — BP 118/76 | Ht 64.0 in | Wt 157.0 lb

## 2018-12-12 DIAGNOSIS — Z13220 Encounter for screening for lipoid disorders: Secondary | ICD-10-CM

## 2018-12-12 DIAGNOSIS — Z01419 Encounter for gynecological examination (general) (routine) without abnormal findings: Secondary | ICD-10-CM

## 2018-12-12 NOTE — Addendum Note (Signed)
Addended by: Nelva Nay on: 12/12/2018 02:39 PM   Modules accepted: Orders

## 2018-12-12 NOTE — Patient Instructions (Signed)
Return for fasting blood work  Follow-up in 1 year for annual exam

## 2018-12-12 NOTE — Progress Notes (Signed)
    Laura Osborne 1985-04-21 [id number]        33 y.o.  [id number] for annual gynecologic exam.  Without gynecologic complaints  Past medical history,surgical history, problem list, medications, allergies, family history and social history were all reviewed and documented as reviewed in the EPIC chart.  ROS:  Performed with pertinent positives and negatives included in the history, assessment and plan.   Additional significant findings : None   Exam: Caryn Bee assistant Vitals:   12/12/18 1401  BP: 118/76  Weight: 157 lb (71.2 kg)  Height: 5\' 4"  (1.626 m)   Body mass index is 26.95 kg/m.  General appearance:  Normal affect, orientation and appearance. Skin: Grossly normal HEENT: Without gross lesions.  No cervical or supraclavicular adenopathy. Thyroid normal.  Lungs:  Clear without wheezing, rales or rhonchi Cardiac: RR, without RMG Abdominal:  Soft, nontender, without masses, guarding, rebound, organomegaly or hernia Breasts:  Examined lying and sitting without masses, retractions, discharge or axillary adenopathy. Pelvic:  Ext, BUS, Vagina: Normal  Cervix: Normal with LEEP changes.  Pap smear done  Uterus: Retroverted, normal size, shape and contour, midline and mobile nontender   Adnexa: Without masses or tenderness    Anus and perineum: Normal   Rectovaginal: Normal sphincter tone without palpated masses or tenderness.    Assessment/Plan:  33 y.o. G54P2002 female for annual gynecologic exam.  With regular menses, abstinent contraception  1. Contraception.  Patient remains abstinent.  We will follow-up if becomes an issue. 2. History of LEEP 2017.  Pap smear/HPV negative 2018.  Pap smear normal 2019.  Pap smear done today.  If negative then plan less frequent screening intervals. 3. Breast health.  SBE monthly reviewed.  Breast exam normal today.  Will plan mammography at age 59. 23. Health maintenance.  Future orders placed to return for fasting CBCs, CMP and lipid  profile.  Follow-up 1 year, sooner as needed   Anastasio Auerbach MD, 2:21 PM 12/12/2018

## 2018-12-13 LAB — PAP IG W/ RFLX HPV ASCU

## 2018-12-17 ENCOUNTER — Encounter: Payer: Self-pay | Admitting: Gynecology

## 2020-01-16 ENCOUNTER — Other Ambulatory Visit: Payer: Self-pay

## 2020-01-16 ENCOUNTER — Encounter: Payer: Self-pay | Admitting: Obstetrics and Gynecology

## 2020-01-16 ENCOUNTER — Ambulatory Visit (INDEPENDENT_AMBULATORY_CARE_PROVIDER_SITE_OTHER): Payer: No Typology Code available for payment source | Admitting: Obstetrics and Gynecology

## 2020-01-16 VITALS — BP 118/74 | Ht 64.0 in | Wt 160.0 lb

## 2020-01-16 DIAGNOSIS — Z13220 Encounter for screening for lipoid disorders: Secondary | ICD-10-CM | POA: Diagnosis not present

## 2020-01-16 DIAGNOSIS — Z01419 Encounter for gynecological examination (general) (routine) without abnormal findings: Secondary | ICD-10-CM | POA: Insufficient documentation

## 2020-01-16 DIAGNOSIS — Z124 Encounter for screening for malignant neoplasm of cervix: Secondary | ICD-10-CM

## 2020-01-16 LAB — CBC
HCT: 40.6 % (ref 35.0–45.0)
Hemoglobin: 13.5 g/dL (ref 11.7–15.5)
MCH: 28.7 pg (ref 27.0–33.0)
MCHC: 33.3 g/dL (ref 32.0–36.0)
MCV: 86.4 fL (ref 80.0–100.0)
MPV: 10.4 fL (ref 7.5–12.5)
Platelets: 274 10*3/uL (ref 140–400)
RBC: 4.7 10*6/uL (ref 3.80–5.10)
RDW: 11.9 % (ref 11.0–15.0)
WBC: 4.4 10*3/uL (ref 3.8–10.8)

## 2020-01-16 LAB — COMPREHENSIVE METABOLIC PANEL
AG Ratio: 1.3 (calc) (ref 1.0–2.5)
ALT: 11 U/L (ref 6–29)
AST: 15 U/L (ref 10–30)
Albumin: 4.3 g/dL (ref 3.6–5.1)
Alkaline phosphatase (APISO): 47 U/L (ref 31–125)
BUN: 11 mg/dL (ref 7–25)
CO2: 24 mmol/L (ref 20–32)
Calcium: 9.2 mg/dL (ref 8.6–10.2)
Chloride: 106 mmol/L (ref 98–110)
Creat: 0.77 mg/dL (ref 0.50–1.10)
Globulin: 3.4 g/dL (calc) (ref 1.9–3.7)
Glucose, Bld: 79 mg/dL (ref 65–99)
Potassium: 4.1 mmol/L (ref 3.5–5.3)
Sodium: 137 mmol/L (ref 135–146)
Total Bilirubin: 0.4 mg/dL (ref 0.2–1.2)
Total Protein: 7.7 g/dL (ref 6.1–8.1)

## 2020-01-16 LAB — LIPID PANEL
Cholesterol: 152 mg/dL (ref <200)
HDL: 40 mg/dL — ABNORMAL LOW (ref >=50)
LDL Cholesterol (Calc): 98 mg/dL (calc)
Non-HDL Cholesterol (Calc): 112 mg/dL (calc) (ref <130)
Total CHOL/HDL Ratio: 3.8 (calc) (ref <5.0)
Triglycerides: 62 mg/dL (ref <150)

## 2020-01-16 NOTE — Progress Notes (Signed)
   Laura Osborne July 07, 1985 [id number]  SUBJECTIVE:  34 y.o. C1K4818 female for annual routine gynecologic exam and Pap smear. She has no gynecologic concerns.    Current Outpatient Medications  Medication Sig Dispense Refill  . b complex vitamins tablet Take 1 tablet by mouth daily.    . cetirizine (ZYRTEC) 10 MG tablet Take 10 mg by mouth daily.    . Echinacea 80 MG CAPS Take by mouth.     No current facility-administered medications for this visit.   Allergies: Patient has no known allergies.  Patient's last menstrual period was 12/25/2019.  Past medical history,surgical history, problem list, medications, allergies, family history and social history were all reviewed and documented as reviewed in the EPIC chart.  ROS: Pertinent positives and negatives as reviewed in HPI.   OBJECTIVE:  BP 118/74   Ht 5\' 4"  (1.626 m)   Wt 160 lb (72.6 kg)   LMP 12/25/2019   BMI 27.46 kg/m  The patient appears well, alert, oriented, in no distress. ENT normal.  No cervical or supraclavicular adenopathy or thyromegaly.  Lungs are clear, good air entry, no wheezes, rhonchi or rales. S1 and S2 normal, no murmurs, regular rate and rhythm.  Abdomen soft without tenderness, guarding, mass or organomegaly.  Neurological is normal, no focal findings.  BREAST EXAM: breasts appear normal, no suspicious masses, no skin or nipple changes or axillary nodes  PELVIC EXAM: VULVA: normal appearing vulva with no masses, tenderness or lesions, VAGINA: normal appearing vagina with normal color and discharge, no lesions, CERVIX: Friable erythematous tissue present on the cervical portio likely consistent with ectropion, normal appearing cervix without discharge or lesions, UTERUS: uterus is normal size, shape, consistency and nontender, ADNEXA: normal adnexa in size, nontender and no masses, PAP: Pap smear done today, thin-prep method  Chaperone: 12/27/2019 present during the examination  ASSESSMENT:  34  y.o. 30 here for annual gynecologic exam  PLAN:   1. No hormonal or menstrual concerns.  2. Pap smear 2020.  Normal cytology but absent transformation zone component, so we did repeat the Pap smear today.  History of CIN 3 and LEEP in 2017.  Pap smear/HPV negative in 2018, normal Pap smear 2019.  We discussed the appearance of the cervix today and that it is likely normal. 3. Contraception.  She remains abstinent but will let 2020 know if she is in need of contraception. 4. Breast exam normal.  Self breast awareness encouraged.  Mammography screening starts at age 44. 5. Health maintenance.  She will proceed to lab today for routine screening fasting blood work (lipids, CBC, CMP).  Return annually or sooner, prn.  41 MD 01/16/20

## 2020-01-20 LAB — PAP IG W/ RFLX HPV ASCU

## 2020-08-04 ENCOUNTER — Ambulatory Visit
Admission: EM | Admit: 2020-08-04 | Discharge: 2020-08-04 | Disposition: A | Discharge status: Home / Self Care | Payer: Managed Care, Other (non HMO) | Attending: Emergency Medicine | Admitting: Emergency Medicine

## 2020-08-04 ENCOUNTER — Other Ambulatory Visit: Payer: Self-pay

## 2020-08-04 DIAGNOSIS — R519 Headache, unspecified: Secondary | ICD-10-CM

## 2020-08-04 DIAGNOSIS — R220 Localized swelling, mass and lump, head: Secondary | ICD-10-CM

## 2020-08-04 MED ORDER — NAPROXEN 500 MG PO TABS: 500.0000 mg | ORAL_TABLET | Freq: Two times a day (BID) | ORAL | 0 refills | Status: AC

## 2020-08-04 NOTE — ED Provider Notes (Signed)
[facility]    CSN: [id number] Arrival date & time: 08/04/20  0809      History   Chief Complaint Chief Complaint  Patient presents with  . Headache    HPI Laura Osborne is a 35 y.o. female presenting today for evaluation of headache and knot on her head.  Reports over the past 2 days she has had discomfort to the back of her head.  Reports that she is not typically prone to headaches so this was out of the ordinary for her.  She noticed yesterday an area of swelling/knot to scalp where discomfort seems to be stemming from.  Denies associated vision changes, light sensitivity, nausea or vomiting.  Denies associated lightheadedness or dizziness.  Denies recent URI symptoms or fevers.  Denies drainage from area.  Denies history of similar.  HPI  Past Medical History:  Diagnosis Date  . Bilateral carpal tunnel syndrome 08/14/2017  . No pertinent past medical history     Patient Active Problem List   Diagnosis Date Noted  . Encntr for gyn exam (general) (routine) w/o abn findings 01/16/2020  . Bilateral carpal tunnel syndrome 08/14/2017  . CIN III (cervical intraepithelial neoplasia grade III) with severe dysplasia 10/12/2015  . Nexplanon in place 05/13/2015    Past Surgical History:  Procedure Laterality Date  . CERVICAL BIOPSY  W/ LOOP ELECTRODE EXCISION  2017   CIN 3 Clear margins    OB History    Gravida  2   Para  2   Term  2   Preterm      AB      Living  2     SAB      IAB      Ectopic      Multiple      Live Births  2            Home Medications    Prior to Admission medications   Medication Sig Start Date End Date Taking? Authorizing Provider  naproxen (NAPROSYN) 500 MG tablet Take 1 tablet (500 mg total) by mouth 2 (two) times daily. 08/04/20  Yes Joud Ingwersen C, PA-C  b complex vitamins tablet Take 1 tablet by mouth daily.    [provider]  cetirizine (ZYRTEC) 10 MG tablet Take 10 mg by mouth daily.     [provider]  Echinacea 80 MG CAPS Take by mouth.    [provider]    Family History Family History  Problem Relation Age of Onset  . Hypertension Maternal Grandmother   . Hypertension Maternal Grandfather   . Diabetes Maternal Grandfather   . Heart disease Maternal Grandfather   . Heart attack Father     Social History Social History   Tobacco Use  . Smoking status: Never Smoker  . Smokeless tobacco: Never Used  Vaping Use  . Vaping Use: Never used  Substance Use Topics  . Alcohol use: Yes    Alcohol/week: 0.0 standard drinks    Comment: Rare  . Drug use: No     Allergies   Patient has no known allergies.   Review of Systems Review of Systems  Constitutional: Negative for fatigue and fever.  HENT: Negative for congestion, sinus pressure and sore throat.   Eyes: Negative for photophobia, pain and visual disturbance.  Respiratory: Negative for cough and shortness of breath.   Cardiovascular: Negative for chest pain.  Gastrointestinal: Negative for abdominal pain, nausea and vomiting.  Genitourinary: Negative for decreased urine  volume and hematuria.  Musculoskeletal: Negative for myalgias, neck pain and neck stiffness.  Neurological: Positive for headaches. Negative for dizziness, syncope, facial asymmetry, speech difficulty, weakness, light-headedness and numbness.     Physical Exam Triage Vital Signs ED Triage Vitals  Enc Vitals Group     BP      Pulse      Resp      Temp      Temp src      SpO2      Weight      Height      Head Circumference      Peak Flow      Pain Score      Pain Loc      Pain Edu?      Excl. in GC?    No data found.  Updated Vital Signs BP (!) 144/80 (BP Location: Left Arm)   Pulse 82   Temp 98.1 F (36.7 C) (Oral)   Resp 16   LMP 07/28/2020   SpO2 98%   Visual Acuity Right Eye Distance:   Left Eye Distance:   Bilateral Distance:    Right Eye Near:   Left Eye Near:    Bilateral Near:      Physical Exam Vitals and nursing note reviewed.  Constitutional:      Appearance: She is well-developed.     Comments: No acute distress  HENT:     Head: Normocephalic and atraumatic.     Ears:     Comments: Bilateral ears without tenderness to palpation of external auricle, tragus and mastoid, EAC's without erythema or swelling, TM's with good bony landmarks and cone of light. Non erythematous.     Nose: Nose normal.     Mouth/Throat:     Comments: Oral mucosa pink and moist, no tonsillar enlargement or exudate. Posterior pharynx patent and nonerythematous, no uvula deviation or swelling. Normal phonation. Eyes:     Extraocular Movements: Extraocular movements intact.     Conjunctiva/sclera: Conjunctivae normal.     Pupils: Pupils are equal, round, and reactive to light.  Neck:     Comments: Full active range of motion of neck Cardiovascular:     Rate and Rhythm: Normal rate and regular rhythm.  Pulmonary:     Effort: Pulmonary effort is normal. No respiratory distress.     Comments: Breathing comfortably at rest, CTABL, no wheezing, rales or other adventitious sounds auscultated  Abdominal:     General: There is no distension.  Musculoskeletal:        General: Normal range of motion.     Cervical back: Neck supple.  Skin:    General: Skin is warm and dry.     Comments: Scalp without rash discoloration or erythema, right lower occipital area with area that does feel more prominent compared to left without associated induration or fluctuance.  Neurological:     General: No focal deficit present.     Mental Status: She is alert and oriented to person, place, and time. Mental status is at baseline.     Cranial Nerves: No cranial nerve deficit.     Motor: No weakness.     Gait: Gait normal.      UC Treatments / Results  Labs (all labs ordered are listed, but only abnormal results are displayed) Labs Reviewed - No data to display  EKG   Radiology No results  found.  Procedures Procedures (including critical care time)  Medications Ordered in UC Medications - No  data to display  Initial Impression / Assessment and Plan / UC Course  I have reviewed the triage vital signs and the nursing notes.  Pertinent labs & imaging results that were available during my care of the patient were reviewed by me and considered in my medical decision making (see chart for details).     Area on scalp does not appear suggestive of abscess or infection or other rash at this time.  Possible underlying cyst contributing to swelling, seems more superficial rather than related to intracranial cause.  No neuro deficits or other systemic symptoms.  Recommending continued anti-inflammatories warm compresses and close monitoring.  Encouraged follow-up with PCP/dermatology if area persisting or enlarging.  If developing any signs of infection advised patient to return. Discussed strict return precautions. Patient verbalized understanding and is agreeable with plan.   Final Clinical Impressions(s) / UC Diagnoses   Final diagnoses:  Acute nonintractable headache, unspecified headache type  Superficial swelling of scalp     Discharge Instructions     May use Naprosyn twice daily with food or ibuprofen 800 mg every 8 hours as needed for headaches, pain and swelling Warm compresses to area and close monitoring Follow-up with primary care dermatology if area enlarging/worsening Please go to emergency room if developing any vision changes, dizziness, lightheadedness    ED Prescriptions    Medication Sig Dispense Auth. Provider   naproxen (NAPROSYN) 500 MG tablet Take 1 tablet (500 mg total) by mouth 2 (two) times daily. 30 tablet [place] Dowd, [place] C, PA-C     PDMP not reviewed this encounter.   Lew Dawes, [place] 08/04/20 [phone]

## 2020-08-04 NOTE — ED Triage Notes (Signed)
Pt c/o a headache since Sunday. States noticed a tender knot to back of her head yesterday in the spot of her headaches. States no relief with ibuprofen.

## 2020-08-04 NOTE — Discharge Instructions (Signed)
May use Naprosyn twice daily with food or ibuprofen 800 mg every 8 hours as needed for headaches, pain and swelling Warm compresses to area and close monitoring Follow-up with primary care dermatology if area enlarging/worsening Please go to emergency room if developing any vision changes, dizziness, lightheadedness

## 2020-09-27 ENCOUNTER — Encounter: Payer: Self-pay | Admitting: Emergency Medicine

## 2020-09-27 ENCOUNTER — Ambulatory Visit (INDEPENDENT_AMBULATORY_CARE_PROVIDER_SITE_OTHER): Payer: Managed Care, Other (non HMO)

## 2020-09-27 ENCOUNTER — Ambulatory Visit
Admission: EM | Admit: 2020-09-27 | Discharge: 2020-09-27 | Disposition: A | Discharge status: Home / Self Care | Payer: Managed Care, Other (non HMO) | Attending: Family Medicine | Admitting: Family Medicine

## 2020-09-27 ENCOUNTER — Other Ambulatory Visit: Payer: Self-pay

## 2020-09-27 DIAGNOSIS — J988 Other specified respiratory disorders: Secondary | ICD-10-CM | POA: Diagnosis not present

## 2020-09-27 DIAGNOSIS — R509 Fever, unspecified: Secondary | ICD-10-CM | POA: Diagnosis not present

## 2020-09-27 DIAGNOSIS — R059 Cough, unspecified: Secondary | ICD-10-CM | POA: Diagnosis not present

## 2020-09-27 MED ORDER — AZITHROMYCIN 250 MG PO TABS: 250.0000 mg | ORAL_TABLET | Freq: Every day | ORAL | 0 refills | Status: DC

## 2020-09-27 NOTE — Discharge Instructions (Signed)
Continue treating your fever aggressively.   Medication as prescribed.  COVID testing should be back soon. Check mychart account.  If you worsen, go to the ER.  Take care  Dr. Adriana Simas

## 2020-09-27 NOTE — ED Provider Notes (Signed)
Laura Osborne    CSN: [id number] Arrival date & time: 09/27/20  6283      History   Chief Complaint Chief Complaint  Patient presents with   Cough    HPI  35 year old female presents with persistent fever.  Symptoms initially started on 6/17.  She had cough and congestion.  She states that she had a negative home COVID test on 6/20.  She continued to be symptomatic and was seen at a local [facility] on Monday of this week (6/27).  She was diagnosed with sinusitis and placed on Augmentin.  Patient states that her cough and congestion seems to have improved but she continues to run fever.  Fever has been as high as 102.  Improves with Tylenol and ibuprofen but then recurs again.  She reports associated chills and body aches.  She has some residual cough and congestion but no other respiratory symptoms.  Shortness of breath at times particularly when she is running a fever and when she exerts herself.  No recent tick bites.  She has had some weight loss from her illness.  No reports of localized joint pain and swelling consistent with synovitis.  No other reported symptoms.  No other complaints.  Patient Active Problem List   Diagnosis Date Noted   Encntr for gyn exam (general) (routine) w/o abn findings 01/16/2020   Bilateral carpal tunnel syndrome 08/14/2017   CIN III (cervical intraepithelial neoplasia grade III) with severe dysplasia 10/12/2015   Nexplanon in place 05/13/2015    Past Surgical History:  Procedure Laterality Date   CERVICAL BIOPSY  W/ LOOP ELECTRODE EXCISION  2017   CIN 3 Clear margins    OB History     Gravida  2   Para  2   Term  2   Preterm      AB      Living  2      SAB      IAB      Ectopic      Multiple      Live Births  2            Home Medications    Prior to Admission medications   Medication Sig Start Date End Date Taking? Authorizing Provider  azithromycin (ZITHROMAX) 250 MG tablet Take 1 tablet (250 mg  total) by mouth daily. Take first 2 tablets together, then 1 every day until finished. 09/27/20  Yes Ziare Orrick G, DO  b complex vitamins tablet Take 1 tablet by mouth daily.   Yes [provider]  benzonatate (TESSALON) 100 MG capsule Take by mouth. 09/21/20  Yes [provider]  cetirizine (ZYRTEC) 10 MG tablet Take 10 mg by mouth daily.   Yes [provider]  ibuprofen (ADVIL) 200 MG tablet Take 200 mg by mouth every 6 (six) hours as needed.   Yes [provider]  pseudoephedrine (SUDAFED) 30 MG tablet Take 30 mg by mouth every 4 (four) hours as needed for congestion.   Yes [provider]  amoxicillin-clavulanate (AUGMENTIN) 875-125 MG tablet Take 1 tablet by mouth 2 (two) times daily. 09/21/20   [provider]  Echinacea 80 MG CAPS Take by mouth. Patient not taking: Reported on 09/27/2020    [provider]  naproxen (NAPROSYN) 500 MG tablet Take 1 tablet (500 mg total) by mouth 2 (two) times daily. 08/04/20   Wieters, Junius Creamer, PA-C    Family History Family History  Problem Relation Age of Onset  Hypertension Maternal Grandmother    Hypertension Maternal Grandfather    Diabetes Maternal Grandfather    Heart disease Maternal Grandfather    Heart attack Father     Social History Social History   Tobacco Use   Smoking status: Never   Smokeless tobacco: Never  Vaping Use   Vaping Use: Never used  Substance Use Topics   Alcohol use: Yes    Alcohol/week: 0.0 standard drinks    Comment: Rare   Drug use: No     Allergies   Patient has no known allergies.   Review of Systems Review of Systems Per HPI  Physical Exam Triage Vital Signs ED Triage Vitals  Enc Vitals Group     BP 09/27/20 0944 119/78     Pulse Rate 09/27/20 0944 (!) 103     Resp 09/27/20 0944 (!) 24     Temp 09/27/20 0944 99.5 F (37.5 C)     Temp Source 09/27/20 0944 Oral     SpO2 09/27/20 0944 96 %     Weight --      Height --      Head  Circumference --      Peak Flow --      Pain Score 09/27/20 0939 0     Pain Loc --      Pain Edu? --      Excl. in GC? --    Updated Vital Signs BP 119/78 (BP Location: Left Arm)   Pulse (!) 103   Temp 99.5 F (37.5 C) (Oral)   Resp (!) 24   LMP 08/28/2020   SpO2 96%   Visual Acuity Right Eye Distance:   Left Eye Distance:   Bilateral Distance:    Right Eye Near:   Left Eye Near:    Bilateral Near:     Physical Exam Constitutional:      General: She is not in acute distress.    Appearance: Normal appearance. She is not ill-appearing.  HENT:     Head: Normocephalic and atraumatic.     Right Ear: Tympanic membrane normal.     Left Ear: Tympanic membrane normal.     Mouth/Throat:     Pharynx: Oropharynx is clear. No oropharyngeal exudate or posterior oropharyngeal erythema.  Eyes:     General:        Right eye: No discharge.        Left eye: No discharge.     Conjunctiva/sclera: Conjunctivae normal.  Cardiovascular:     Rate and Rhythm: Regular rhythm. Tachycardia present.  Pulmonary:     Effort: Pulmonary effort is normal.     Breath sounds: No wheezing or rales.  Abdominal:     General: There is no distension.     Palpations: Abdomen is soft.     Tenderness: no abdominal tenderness  Neurological:     Mental Status: She is alert.  Psychiatric:        Mood and Affect: Mood normal.        Behavior: Behavior normal.     UC Treatments / Results  Labs (all labs ordered are listed, but only abnormal results are displayed) Labs Reviewed  NOVEL CORONAVIRUS, NAA    EKG   Radiology DG Chest 2 View  Result Date: 09/27/2020 CLINICAL DATA:  6/17 had cough and congestion 6/20 did a otc covid test, negative. 6/27 was running fevers of 102. [facility]. Given tessalon and augmentin Patient has not had any  improvement.Cough, persistent fever EXAM: CHEST - 2 VIEW COMPARISON:  Laura Osborne. FINDINGS: The heart size and mediastinal  contours are within normal limits. Both lungs are clear. The visualized skeletal structures are unremarkable. IMPRESSION: No active cardiopulmonary disease. Electronically Signed   By: Genevive Bi M.D.   On: 09/27/2020 10:27    Procedures Procedures (including critical Osborne time)  Medications Ordered in UC Medications - No data to display  Initial Impression / Assessment and Plan / UC Course  I have reviewed the triage vital signs and the nursing notes.  Pertinent labs & imaging results that were available during my Osborne of the patient were reviewed by me and considered in my medical decision making (see chart for details).    35 year old female presents with persistent fever.  Etiology is unclear at this time.  Awaiting COVID test results.  Chest x-ray was obtained was independently reviewed by me.  Interpretation: Normal chest x-ray.  No evidence of pneumonia.  I have asked her to continue Augmentin as.  Stay prescribed.  I am adding azithromycin to cover for atypicals.  She is to go to the hospital if she worsens.  Continue treating fever aggressively.  Final Clinical Impressions(s) / UC Diagnoses   Final diagnoses:  Persistent fever  Respiratory infection     Discharge Instructions      Continue treating your fever aggressively.   Medication as prescribed.  COVID testing should be back soon. Check mychart account.  If you worsen, go to the ER.  Take Osborne  Dr. Adriana Simas      ED Prescriptions     Medication Sig Dispense Auth. Provider   azithromycin (ZITHROMAX) 250 MG tablet Take 1 tablet (250 mg total) by mouth daily. Take first 2 tablets together, then 1 every day until finished. 6 tablet Tommie Sams, DO      PDMP not reviewed this encounter.   Tommie Sams, DO 09/27/20 1037

## 2020-09-27 NOTE — ED Triage Notes (Signed)
6/17 had cough and congestion 6/20 did a otc covid test, negative.   6/27 was running fevers of 102.  [facility].  Given tessalon and augmentin Patient has not had any improvement.    Continues to run a fever.  Is alternating tylenol and ibuprofen

## 2020-09-28 LAB — SARS-COV-2, NAA 2 DAY TAT

## 2020-09-28 LAB — NOVEL CORONAVIRUS, NAA: SARS-CoV-2, NAA: NOT DETECTED

## 2020-10-02 ENCOUNTER — Other Ambulatory Visit: Payer: Self-pay

## 2020-10-02 ENCOUNTER — Emergency Department (HOSPITAL_COMMUNITY): Payer: Managed Care, Other (non HMO)

## 2020-10-02 ENCOUNTER — Emergency Department (HOSPITAL_COMMUNITY)
Admission: EM | Admit: 2020-10-02 | Discharge: 2020-10-03 | Disposition: A | Discharge status: Home / Self Care | Payer: Managed Care, Other (non HMO) | Attending: Emergency Medicine | Admitting: Emergency Medicine

## 2020-10-02 DIAGNOSIS — R509 Fever, unspecified: Secondary | ICD-10-CM | POA: Diagnosis not present

## 2020-10-02 DIAGNOSIS — J3489 Other specified disorders of nose and nasal sinuses: Secondary | ICD-10-CM | POA: Insufficient documentation

## 2020-10-02 DIAGNOSIS — R0981 Nasal congestion: Secondary | ICD-10-CM | POA: Diagnosis not present

## 2020-10-02 DIAGNOSIS — J019 Acute sinusitis, unspecified: Secondary | ICD-10-CM

## 2020-10-02 LAB — CBC WITH DIFFERENTIAL/PLATELET
Band Neutrophils: 1 %
Basophils Relative: 0 %
Blasts: NONE SEEN %
Eosinophils Relative: 0 %
HCT: 38.2 % (ref 36.0–46.0)
Hemoglobin: 12.5 g/dL (ref 12.0–15.0)
Lymphocytes Relative: 18 %
MCH: 27 pg (ref 26.0–34.0)
MCHC: 32.7 g/dL (ref 30.0–36.0)
MCV: 82.5 fL (ref 80.0–100.0)
Metamyelocytes Relative: NONE SEEN %
Monocytes Relative: 0 %
Myelocytes: NONE SEEN %
Neutrophils Relative %: 81 %
Platelets: 256 10*3/uL (ref 150–400)
Promyelocytes Relative: NONE SEEN %
RBC: 4.63 MIL/uL (ref 3.87–5.11)
RDW: 12.3 % (ref 11.5–15.5)
WBC Morphology: NORMAL
WBC: 4 10*3/uL (ref 4.0–10.5)
nRBC: 0 % (ref 0.0–0.2)
nRBC: NONE SEEN /100 WBC

## 2020-10-02 LAB — BASIC METABOLIC PANEL
Anion gap: 11 (ref 5–15)
BUN: 17 mg/dL (ref 6–20)
CO2: 20 mmol/L — ABNORMAL LOW (ref 22–32)
Calcium: 8.7 mg/dL — ABNORMAL LOW (ref 8.9–10.3)
Chloride: 108 mmol/L (ref 98–111)
Creatinine, Ser: 1.05 mg/dL — ABNORMAL HIGH (ref 0.44–1.00)
GFR, Estimated: >60 mL/min (ref >60)
Glucose, Bld: 83 mg/dL (ref 70–99)
Potassium: 4 mmol/L (ref 3.5–5.1)
Sodium: 139 mmol/L (ref 135–145)

## 2020-10-02 LAB — I-STAT BETA HCG BLOOD, ED (MC, WL, AP ONLY): I-stat hCG, quantitative: <5 m[IU]/mL (ref <5)

## 2020-10-02 NOTE — ED Provider Notes (Signed)
Emergency Medicine Provider Triage Evaluation Note  Laura Osborne , a 35 y.o. female  was evaluated in triage.  Pt complains of headache, congestion, cough, fever ongoing since she was dx with a sinus infection on 6/27. She further reports nausea and an episode of vomiting earlier today.  Augmentin rx on 6/27. On 6/3 she was given a zpack which she finished yesterday  Review of Systems  Positive: headache, congestion, cough, fever,  nausea, vomiting Negative: sob  Physical Exam  BP 100/72 (BP Location: Right Arm)   Pulse (!) 111   Temp 99.7 F (37.6 C) (Oral)   Resp 16   Ht 5\' 4"  (1.626 m)   Wt 67.6 kg   SpO2 100%   BMI 25.58 kg/m  Gen:   Awake, no distress   Resp:  Normal effort  MSK:   Moves extremities without difficulty   Medical Decision Making  Medically screening exam initiated at 7:55 PM.  Appropriate orders placed.  Laura Osborne was informed that the remainder of the evaluation will be completed by another provider, this initial triage assessment does not replace that evaluation, and the importance of remaining in the ED until their evaluation is complete.     Jake Bathe, PA-C 10/02/20 1955    12/03/20, DO 10/02/20 2351

## 2020-10-02 NOTE — ED Triage Notes (Signed)
Pt came in with c/o fever since 6/27. Pt was diagnosed with a severe sinus infection, prescribed Augmentin. Pt went to UC on 7/3 and was given a Z pack. Pt endorses fevers on and off, nausea and nasal congestion.

## 2020-10-03 MED ORDER — DOXYCYCLINE HYCLATE 100 MG PO CAPS: 100.0000 mg | ORAL_CAPSULE | Freq: Two times a day (BID) | ORAL | 0 refills | Status: DC

## 2020-10-03 MED ORDER — PREDNISONE 10 MG PO TABS: 20.0000 mg | ORAL_TABLET | Freq: Two times a day (BID) | ORAL | 0 refills | Status: DC

## 2020-10-03 NOTE — ED Provider Notes (Signed)
[facility]-EMERGENCY DEPT Provider Note   CSN: [id number] Arrival date & time: 10/02/20  1928     History Chief Complaint  Patient presents with   Nasal Congestion   Nausea    Laura Osborne is a 35 y.o. female.  Patient is a 35 year old female with no significant past medical history.  She presents with a 3-week history of persistent nasal congestion, drainage, and fever.  She was seen previously and treated with both Augmentin followed by Zithromax, however is not improving.  She reports continued fevers and congestion.  She had a negative chest x-ray and negative COVID test performed at an outside clinic.  The history is provided by the patient.      Past Medical History:  Diagnosis Date   Bilateral carpal tunnel syndrome 08/14/2017   No pertinent past medical history     Patient Active Problem List   Diagnosis Date Noted   Encntr for gyn exam (general) (routine) w/o abn findings 01/16/2020   Bilateral carpal tunnel syndrome 08/14/2017   CIN III (cervical intraepithelial neoplasia grade III) with severe dysplasia 10/12/2015   Nexplanon in place 05/13/2015    Past Surgical History:  Procedure Laterality Date   CERVICAL BIOPSY  W/ LOOP ELECTRODE EXCISION  2017   CIN 3 Clear margins     OB History     Gravida  2   Para  2   Term  2   Preterm      AB      Living  2      SAB      IAB      Ectopic      Multiple      Live Births  2           Family History  Problem Relation Age of Onset   Hypertension Maternal Grandmother    Hypertension Maternal Grandfather    Diabetes Maternal Grandfather    Heart disease Maternal Grandfather    Heart attack Father     Social History   Tobacco Use   Smoking status: Never   Smokeless tobacco: Never  Vaping Use   Vaping Use: Never used  Substance Use Topics   Alcohol use: Yes    Alcohol/week: 0.0 standard drinks    Comment: Rare   Drug use: No    Home Medications Prior  to Admission medications   Medication Sig Start Date End Date Taking? Authorizing Provider  amoxicillin-clavulanate (AUGMENTIN) 875-125 MG tablet Take 1 tablet by mouth 2 (two) times daily. 09/21/20   [provider]  azithromycin (ZITHROMAX) 250 MG tablet Take 1 tablet (250 mg total) by mouth daily. Take first 2 tablets together, then 1 every day until finished. 09/27/20   Tommie Sams, DO  b complex vitamins tablet Take 1 tablet by mouth daily.    [provider]  benzonatate (TESSALON) 100 MG capsule Take by mouth. 09/21/20   [provider]  cetirizine (ZYRTEC) 10 MG tablet Take 10 mg by mouth daily.    [provider]  Echinacea 80 MG CAPS Take by mouth. Patient not taking: Reported on 09/27/2020    [provider]  ibuprofen (ADVIL) 200 MG tablet Take 200 mg by mouth every 6 (six) hours as needed.    [provider]  naproxen (NAPROSYN) 500 MG tablet Take 1 tablet (500 mg total) by mouth 2 (two) times daily. 08/04/20   Wieters, Hallie C, PA-C  pseudoephedrine (SUDAFED) 30 MG tablet Take  30 mg by mouth every 4 (four) hours as needed for congestion.    [provider]    Allergies    Patient has no known allergies.  Review of Systems   Review of Systems  All other systems reviewed and are negative.  Physical Exam Updated Vital Signs BP 100/72 (BP Location: Right Arm)   Pulse (!) 111   Temp 99.7 F (37.6 C) (Oral)   Resp 16   Ht 5\' 4"  (1.626 m)   Wt 67.6 kg   SpO2 100%   BMI 25.58 kg/m   Physical Exam Vitals and nursing note reviewed.  Constitutional:      General: She is not in acute distress.    Appearance: She is well-developed. She is not diaphoretic.  HENT:     Head: Normocephalic and atraumatic.     Right Ear: Tympanic membrane and ear canal normal.     Left Ear: Tympanic membrane and ear canal normal.     Nose: Congestion present.     Comments: There is mild maxillofacial tenderness to palpation.     Mouth/Throat:     Mouth: Mucous membranes are moist.     Pharynx: No oropharyngeal exudate or posterior oropharyngeal erythema.  Cardiovascular:     Rate and Rhythm: Normal rate and regular rhythm.     Heart sounds: No murmur heard.   No friction rub. No gallop.  Pulmonary:     Effort: Pulmonary effort is normal. No respiratory distress.     Breath sounds: Normal breath sounds. No wheezing.  Abdominal:     General: Bowel sounds are normal. There is no distension.     Palpations: Abdomen is soft.     Tenderness: There is no abdominal tenderness.  Musculoskeletal:        General: Normal range of motion.     Cervical back: Normal range of motion and neck supple.  Skin:    General: Skin is warm and dry.  Neurological:     General: No focal deficit present.     Mental Status: She is alert and oriented to person, place, and time.    ED Results / Procedures / Treatments   Labs (all labs ordered are listed, but only abnormal results are displayed) Labs Reviewed  BASIC METABOLIC PANEL - Abnormal; Notable for the following components:      Result Value   CO2 20 (*)    Creatinine, Ser 1.05 (*)    Calcium 8.7 (*)    All other components within normal limits  RESP PANEL BY RT-PCR (FLU A&B, COVID) ARPGX2  CBC WITH DIFFERENTIAL/PLATELET  I-STAT BETA HCG BLOOD, ED ([facility], [facility], [facility] ONLY)    EKG None  Radiology DG Chest 2 View  Result Date: 10/02/2020 CLINICAL DATA:  Cough, congestion, fever, nausea vomiting, intermittent for 21 days, completed 2 rounds of antibiotics. EXAM: CHEST - 2 VIEW COMPARISON:  09/27/2020 FINDINGS: No consolidation, features of edema, pneumothorax, or effusion. Pulmonary vascularity is normally distributed. The cardiomediastinal contours are unremarkable. No acute osseous or soft tissue abnormality. IMPRESSION: No acute cardiopulmonary abnormality. Electronically Signed   By: 11/28/2020 M.D.   On: 10/02/2020 20:13    Procedures Procedures   Medications Ordered in  ED Medications - No data to display  ED Course  I have reviewed the triage vital signs and the nursing notes.  Pertinent labs & imaging results that were available during my care of the patient were reviewed by me and considered in my medical decision making (  see chart for details).    MDM Rules/Calculators/A&P  Patient presenting with persistent nasal drainage and fevers for 3 weeks.  COVID test negative and chest x-ray negative.  She has not improved despite 2 separate antibiotics.  At this point, I will change antibiotic to doxycycline, add prednisone, and have the patient follow-up with ENT if not proving with this third round of antibiotics.  Final Clinical Impression(s) / ED Diagnoses Final diagnoses:  None    Rx / DC Orders ED Discharge Orders     None        Geoffery Lyons, MD 10/03/20 0121

## 2020-10-03 NOTE — Discharge Instructions (Addendum)
Begin taking doxycycline and prednisone as prescribed.  Continue Tylenol 1000 mg rotated with ibuprofen 600 mg every 4 hours as needed for fever.  If your symptoms are not improving in the next few days, follow-up with ENT.  The contact information for [facility] has been provided in this discharge summary for you to call and make these arrangements.

## 2020-11-02 DIAGNOSIS — R0981 Nasal congestion: Secondary | ICD-10-CM | POA: Insufficient documentation

## 2020-12-23 ENCOUNTER — Encounter (HOSPITAL_COMMUNITY): Payer: Self-pay | Admitting: *Deleted

## 2020-12-23 ENCOUNTER — Emergency Department (HOSPITAL_COMMUNITY): Payer: Managed Care, Other (non HMO)

## 2020-12-23 ENCOUNTER — Emergency Department (HOSPITAL_COMMUNITY)
Admission: EM | Admit: 2020-12-23 | Discharge: 2020-12-23 | Disposition: A | Discharge status: Home / Self Care | Payer: Managed Care, Other (non HMO) | Attending: Emergency Medicine | Admitting: Emergency Medicine

## 2020-12-23 DIAGNOSIS — Z20822 Contact with and (suspected) exposure to covid-19: Secondary | ICD-10-CM | POA: Insufficient documentation

## 2020-12-23 DIAGNOSIS — F410 Panic disorder [episodic paroxysmal anxiety] without agoraphobia: Secondary | ICD-10-CM | POA: Diagnosis not present

## 2020-12-23 DIAGNOSIS — R509 Fever, unspecified: Secondary | ICD-10-CM | POA: Diagnosis present

## 2020-12-23 LAB — COMPREHENSIVE METABOLIC PANEL
ALT: 28 U/L (ref 0–44)
AST: 39 U/L (ref 15–41)
Albumin: 3.7 g/dL (ref 3.5–5.0)
Alkaline Phosphatase: 61 U/L (ref 38–126)
Anion gap: 11 (ref 5–15)
BUN: 9 mg/dL (ref 6–20)
CO2: 21 mmol/L — ABNORMAL LOW (ref 22–32)
Calcium: 9.8 mg/dL (ref 8.9–10.3)
Chloride: 102 mmol/L (ref 98–111)
Creatinine, Ser: 0.68 mg/dL (ref 0.44–1.00)
GFR, Estimated: >60 mL/min (ref >60)
Glucose, Bld: 85 mg/dL (ref 70–99)
Potassium: 3.3 mmol/L — ABNORMAL LOW (ref 3.5–5.1)
Sodium: 134 mmol/L — ABNORMAL LOW (ref 135–145)
Total Bilirubin: 0.4 mg/dL (ref 0.3–1.2)
Total Protein: 8.7 g/dL — ABNORMAL HIGH (ref 6.5–8.1)

## 2020-12-23 LAB — CBC WITH DIFFERENTIAL/PLATELET
Abs Immature Granulocytes: 0.02 10*3/uL (ref 0.00–0.07)
Basophils Absolute: 0 10*3/uL (ref 0.0–0.1)
Basophils Relative: 0 %
Eosinophils Absolute: 0 10*3/uL (ref 0.0–0.5)
Eosinophils Relative: 0 %
HCT: 38.4 % (ref 36.0–46.0)
Hemoglobin: 12.6 g/dL (ref 12.0–15.0)
Immature Granulocytes: 0 %
Lymphocytes Relative: 11 %
Lymphs Abs: 0.6 10*3/uL — ABNORMAL LOW (ref 0.7–4.0)
MCH: 27 pg (ref 26.0–34.0)
MCHC: 32.8 g/dL (ref 30.0–36.0)
MCV: 82.2 fL (ref 80.0–100.0)
Monocytes Absolute: 0.3 10*3/uL (ref 0.1–1.0)
Monocytes Relative: 6 %
Neutro Abs: 4 10*3/uL (ref 1.7–7.7)
Neutrophils Relative %: 83 %
Platelets: 275 10*3/uL (ref 150–400)
RBC: 4.67 MIL/uL (ref 3.87–5.11)
RDW: 14.5 % (ref 11.5–15.5)
WBC: 4.8 10*3/uL (ref 4.0–10.5)
nRBC: 0 % (ref 0.0–0.2)

## 2020-12-23 LAB — URINALYSIS, ROUTINE W REFLEX MICROSCOPIC
Bilirubin Urine: NEGATIVE
Glucose, UA: NEGATIVE mg/dL
Hgb urine dipstick: NEGATIVE
Ketones, ur: >80 mg/dL — AB
Leukocytes,Ua: NEGATIVE
Nitrite: NEGATIVE
Protein, ur: NEGATIVE mg/dL
Specific Gravity, Urine: 1.01 (ref 1.005–1.030)
pH: 8 (ref 5.0–8.0)

## 2020-12-23 LAB — RESP PANEL BY RT-PCR (FLU A&B, COVID) ARPGX2
Influenza A by PCR: NEGATIVE
Influenza B by PCR: NEGATIVE
SARS Coronavirus 2 by RT PCR: NEGATIVE

## 2020-12-23 LAB — I-STAT BETA HCG BLOOD, ED (MC, WL, AP ONLY): I-stat hCG, quantitative: <5 m[IU]/mL (ref <5)

## 2020-12-23 MED ORDER — LORAZEPAM 1 MG PO TABS
1.0000 mg | ORAL_TABLET | Freq: Once | ORAL | Status: AC
  Administered 2020-12-23: 1 mg via ORAL
  Filled 2020-12-23: qty 1

## 2020-12-23 MED ORDER — ACETAMINOPHEN 325 MG PO TABS
650.0000 mg | ORAL_TABLET | Freq: Once | ORAL | Status: AC
  Administered 2020-12-23: 650 mg via ORAL
  Filled 2020-12-23: qty 2

## 2020-12-23 MED ORDER — IBUPROFEN 200 MG PO TABS
600.0000 mg | ORAL_TABLET | Freq: Once | ORAL | Status: AC
  Administered 2020-12-23: 600 mg via ORAL
  Filled 2020-12-23: qty 3

## 2020-12-23 NOTE — ED Triage Notes (Signed)
Per EMS-coming from home-no specific trigger for anxiety- takes OTC meds

## 2020-12-23 NOTE — Discharge Instructions (Addendum)
Call your primary care doctor or specialist as discussed in the next 2-3 days.   Return immediately back to the ER if:  Your symptoms worsen within the next 12-24 hours. You develop new symptoms such as new fevers, persistent vomiting, new pain, shortness of breath, or new weakness or numbness, or if you have any other concerns.  

## 2020-12-23 NOTE — ED Provider Notes (Signed)
Emergency Medicine Provider Triage Evaluation Note  Laura Osborne , a 35 y.o. female  was evaluated in triage.  Pt complains of anxiety, states she could not catch her breath, her arms went numb and her fingers locked up and felt numb. Reports she has been feeling fatigued and was recently dx with anemia. .  Review of Systems  Positive: Fatigue, anxiety Negative: Cough, vomiting  Physical Exam  BP 119/79 (BP Location: Left Arm)   Pulse 99   Temp (!) 102.5 F (39.2 C) (Oral)   Resp 18   LMP 11/26/2020   SpO2 98%  Gen:   Awake, no distress   Resp:  Normal effort  MSK:   Moves extremities without difficulty   Medical Decision Making  Medically screening exam initiated at 1:51 PM.  Appropriate orders placed.  Laura Osborne was informed that the remainder of the evaluation will be completed by another provider, this initial triage assessment does not replace that evaluation, and the importance of remaining in the ED until their evaluation is complete.     Rayne Du 12/23/20 1351    Cheryll Cockayne, MD 12/23/20 [phone]

## 2020-12-23 NOTE — ED Provider Notes (Signed)
[facility]-EMERGENCY DEPT Provider Note   CSN: [id number] Arrival date & time: 12/23/20  1323     History Chief Complaint  Patient presents with   Anxiety    Laura Osborne is a 35 y.o. female.  Patient presents with chief complaint of anxiety, hand spasms.  She states that she suffers from episodes of anxiety in the past but has never had a full-blown panic attack that she thinks she had today.  She states that nothing really seems to trigger it, she suddenly felt very anxious and began to breathe faster, then noticed bilateral hands cramping in words, was unable to open up her hands.  Symptoms lasted for about 15 to 20 minutes and have since completely resolved.  At this time she has no complaints or no symptoms.  Denies headache or chest pain or abdominal pain.  She was noted to be febrile here today but denies any prior fevers.  Denies sore throat denies vomiting or diarrhea.      Past Medical History:  Diagnosis Date   Bilateral carpal tunnel syndrome 08/14/2017   No pertinent past medical history     Patient Active Problem List   Diagnosis Date Noted   Encntr for gyn exam (general) (routine) w/o abn findings 01/16/2020   Bilateral carpal tunnel syndrome 08/14/2017   CIN III (cervical intraepithelial neoplasia grade III) with severe dysplasia 10/12/2015   Nexplanon in place 05/13/2015    Past Surgical History:  Procedure Laterality Date   CERVICAL BIOPSY  W/ LOOP ELECTRODE EXCISION  2017   CIN 3 Clear margins     OB History     Gravida  2   Para  2   Term  2   Preterm      AB      Living  2      SAB      IAB      Ectopic      Multiple      Live Births  2           Family History  Problem Relation Age of Onset   Hypertension Maternal Grandmother    Hypertension Maternal Grandfather    Diabetes Maternal Grandfather    Heart disease Maternal Grandfather    Heart attack Father     Social History   Tobacco Use    Smoking status: Never   Smokeless tobacco: Never  Vaping Use   Vaping Use: Never used  Substance Use Topics   Alcohol use: Yes    Alcohol/week: 0.0 standard drinks    Comment: Rare   Drug use: No    Home Medications Prior to Admission medications   Medication Sig Start Date End Date Taking? Authorizing Provider  amoxicillin-clavulanate (AUGMENTIN) 875-125 MG tablet Take 1 tablet by mouth 2 (two) times daily. 09/21/20   [provider]  azithromycin (ZITHROMAX) 250 MG tablet Take 1 tablet (250 mg total) by mouth daily. Take first 2 tablets together, then 1 every day until finished. 09/27/20   Tommie Sams, DO  b complex vitamins tablet Take 1 tablet by mouth daily.    [provider]  benzonatate (TESSALON) 100 MG capsule Take by mouth. 09/21/20   [provider]  cetirizine (ZYRTEC) 10 MG tablet Take 10 mg by mouth daily.    [provider]  doxycycline (VIBRAMYCIN) 100 MG capsule Take 1 capsule (100 mg total) by mouth 2 (two) times daily. One po bid x 7 days 10/03/20  Geoffery Lyons, MD  Echinacea 80 MG CAPS Take by mouth. Patient not taking: Reported on 09/27/2020    [provider]  ibuprofen (ADVIL) 200 MG tablet Take 200 mg by mouth every 6 (six) hours as needed.    [provider]  naproxen (NAPROSYN) 500 MG tablet Take 1 tablet (500 mg total) by mouth 2 (two) times daily. 08/04/20   Wieters, Hallie C, PA-C  predniSONE (DELTASONE) 10 MG tablet Take 2 tablets (20 mg total) by mouth 2 (two) times daily with a meal. 10/03/20   Geoffery Lyons, MD  pseudoephedrine (SUDAFED) 30 MG tablet Take 30 mg by mouth every 4 (four) hours as needed for congestion.    [provider]    Allergies    Patient has no known allergies.  Review of Systems   Review of Systems  Constitutional:  Negative for fever.  HENT:  Negative for ear pain.   Eyes:  Negative for pain.  Respiratory:  Negative for cough.   Cardiovascular:  Negative for chest  pain.  Gastrointestinal:  Negative for abdominal pain.  Genitourinary:  Negative for flank pain.  Musculoskeletal:  Negative for back pain.  Skin:  Negative for rash.  Neurological:  Negative for headaches.   Physical Exam Updated Vital Signs BP 119/79 (BP Location: Left Arm)   Pulse 98   Temp (!) 101.2 F (38.4 C) (Oral)   Resp 18   LMP 11/26/2020   SpO2 99%   Physical Exam Constitutional:      General: She is not in acute distress.    Appearance: Normal appearance.  HENT:     Head: Normocephalic.     Nose: Nose normal.  Eyes:     Extraocular Movements: Extraocular movements intact.  Cardiovascular:     Rate and Rhythm: Normal rate.  Pulmonary:     Effort: Pulmonary effort is normal.  Musculoskeletal:        General: Normal range of motion.     Cervical back: Normal range of motion.     Right lower leg: No edema.     Left lower leg: No edema.  Neurological:     General: No focal deficit present.     Mental Status: She is alert. Mental status is at baseline.    ED Results / Procedures / Treatments   Labs (all labs ordered are listed, but only abnormal results are displayed) Labs Reviewed  COMPREHENSIVE METABOLIC PANEL - Abnormal; Notable for the following components:      Result Value   Sodium 134 (*)    Potassium 3.3 (*)    CO2 21 (*)    Total Protein 8.7 (*)    All other components within normal limits  URINALYSIS, ROUTINE W REFLEX MICROSCOPIC - Abnormal; Notable for the following components:   Ketones, ur >80 (*)    All other components within normal limits  RESP PANEL BY RT-PCR (FLU A&B, COVID) ARPGX2  CBC WITH DIFFERENTIAL/PLATELET  I-STAT BETA HCG BLOOD, ED ([facility], [facility], [facility] ONLY)    EKG None  Radiology No results found.  Procedures Procedures   Medications Ordered in ED Medications  acetaminophen (TYLENOL) tablet 650 mg (650 mg Oral Given 12/23/20 1428)  ibuprofen (ADVIL) tablet 600 mg (600 mg Oral Given 12/23/20 1547)  LORazepam (ATIVAN) tablet  1 mg (1 mg Oral Given 12/23/20 1547)    ED Course  I have reviewed the triage vital signs and the nursing notes.  Pertinent labs & imaging results that were available during my  care of the patient were reviewed by me and considered in my medical decision making (see chart for details).    MDM Rules/Calculators/A&P                           Patient found to be febrile here but otherwise exam is benign.  She describes what sounds like a panic attack which is now resolved.  Work-up included labs which were unremarkable.  Chest x-ray appears unremarkable no evidence of effusion edema or infiltrate per my read.  COVID test was sent and negative urinalysis unremarkable as well.  Etiology of her symptoms unclear.  Advise close follow-up with her primary care doctor in 2 to 3 days.  Advising immediate return if she has difficulty breathing pain persistent fevers or any additional concerns.  Final Clinical Impression(s) / ED Diagnoses Final diagnoses:  Panic attack  Febrile illness    Rx / DC Orders ED Discharge Orders     None        Cheryll Cockayne, MD 12/23/20 [id number]

## 2021-01-08 ENCOUNTER — Other Ambulatory Visit (HOSPITAL_BASED_OUTPATIENT_CLINIC_OR_DEPARTMENT_OTHER): Payer: Self-pay | Admitting: Family Medicine

## 2021-01-08 ENCOUNTER — Other Ambulatory Visit: Payer: Self-pay

## 2021-01-08 ENCOUNTER — Ambulatory Visit (HOSPITAL_BASED_OUTPATIENT_CLINIC_OR_DEPARTMENT_OTHER)
Admission: RE | Admit: 2021-01-08 | Discharge: 2021-01-08 | Disposition: A | Discharge status: Home / Self Care | Payer: Managed Care, Other (non HMO) | Source: Ambulatory Visit | Attending: Family Medicine | Admitting: Family Medicine

## 2021-01-08 DIAGNOSIS — R0602 Shortness of breath: Secondary | ICD-10-CM

## 2021-01-08 MED ORDER — IOHEXOL 300 MG/ML  SOLN
100.0000 mL | Freq: Once | INTRAMUSCULAR | Status: AC | PRN
  Administered 2021-01-08: 75 mL via INTRAVENOUS

## 2021-01-21 ENCOUNTER — Telehealth: Payer: Self-pay | Admitting: Hematology and Oncology

## 2021-01-21 NOTE — Telephone Encounter (Signed)
Scheduled appt per 10/25 referral. Pt is aware of appt date and time.  

## 2021-02-03 ENCOUNTER — Inpatient Hospital Stay: Payer: Managed Care, Other (non HMO) | Attending: Hematology and Oncology | Admitting: Hematology and Oncology

## 2021-02-03 ENCOUNTER — Other Ambulatory Visit: Payer: Self-pay

## 2021-02-03 ENCOUNTER — Encounter: Payer: Self-pay | Admitting: Hematology and Oncology

## 2021-02-03 ENCOUNTER — Inpatient Hospital Stay: Payer: Managed Care, Other (non HMO)

## 2021-02-03 VITALS — BP 127/84 | HR 102 | Temp 99.4°F | Resp 17 | Wt 146.2 lb

## 2021-02-03 DIAGNOSIS — R0602 Shortness of breath: Secondary | ICD-10-CM | POA: Diagnosis not present

## 2021-02-03 DIAGNOSIS — Z800 Family history of malignant neoplasm of digestive organs: Secondary | ICD-10-CM | POA: Insufficient documentation

## 2021-02-03 DIAGNOSIS — Z79899 Other long term (current) drug therapy: Secondary | ICD-10-CM | POA: Insufficient documentation

## 2021-02-03 DIAGNOSIS — I517 Cardiomegaly: Secondary | ICD-10-CM | POA: Diagnosis not present

## 2021-02-03 DIAGNOSIS — Z8042 Family history of malignant neoplasm of prostate: Secondary | ICD-10-CM | POA: Diagnosis not present

## 2021-02-03 DIAGNOSIS — R5383 Other fatigue: Secondary | ICD-10-CM | POA: Diagnosis not present

## 2021-02-03 DIAGNOSIS — Z20822 Contact with and (suspected) exposure to covid-19: Secondary | ICD-10-CM | POA: Insufficient documentation

## 2021-02-03 DIAGNOSIS — R509 Fever, unspecified: Secondary | ICD-10-CM | POA: Diagnosis not present

## 2021-02-03 DIAGNOSIS — R590 Localized enlarged lymph nodes: Secondary | ICD-10-CM | POA: Insufficient documentation

## 2021-02-03 DIAGNOSIS — Z833 Family history of diabetes mellitus: Secondary | ICD-10-CM | POA: Insufficient documentation

## 2021-02-03 DIAGNOSIS — Z8249 Family history of ischemic heart disease and other diseases of the circulatory system: Secondary | ICD-10-CM | POA: Insufficient documentation

## 2021-02-03 LAB — CMP (CANCER CENTER ONLY)
ALT: 27 U/L (ref 0–44)
AST: 36 U/L (ref 15–41)
Albumin: 3.5 g/dL (ref 3.5–5.0)
Alkaline Phosphatase: 64 U/L (ref 38–126)
Anion gap: 9 (ref 5–15)
BUN: 9 mg/dL (ref 6–20)
CO2: 25 mmol/L (ref 22–32)
Calcium: 9.2 mg/dL (ref 8.9–10.3)
Chloride: 104 mmol/L (ref 98–111)
Creatinine: 0.64 mg/dL (ref 0.44–1.00)
GFR, Estimated: >60 mL/min (ref >60)
Glucose, Bld: 83 mg/dL (ref 70–99)
Potassium: 3.7 mmol/L (ref 3.5–5.1)
Sodium: 138 mmol/L (ref 135–145)
Total Bilirubin: 0.3 mg/dL (ref 0.3–1.2)
Total Protein: 8.3 g/dL — ABNORMAL HIGH (ref 6.5–8.1)

## 2021-02-03 LAB — CBC WITH DIFFERENTIAL (CANCER CENTER ONLY)
Abs Immature Granulocytes: 0.01 10*3/uL (ref 0.00–0.07)
Basophils Absolute: 0 10*3/uL (ref 0.0–0.1)
Basophils Relative: 0 %
Eosinophils Absolute: 0 10*3/uL (ref 0.0–0.5)
Eosinophils Relative: 0 %
HCT: 32.6 % — ABNORMAL LOW (ref 36.0–46.0)
Hemoglobin: 10.8 g/dL — ABNORMAL LOW (ref 12.0–15.0)
Immature Granulocytes: 0 %
Lymphocytes Relative: 21 %
Lymphs Abs: 0.6 10*3/uL — ABNORMAL LOW (ref 0.7–4.0)
MCH: 26.8 pg (ref 26.0–34.0)
MCHC: 33.1 g/dL (ref 30.0–36.0)
MCV: 80.9 fL (ref 80.0–100.0)
Monocytes Absolute: 0.3 10*3/uL (ref 0.1–1.0)
Monocytes Relative: 9 %
Neutro Abs: 2 10*3/uL (ref 1.7–7.7)
Neutrophils Relative %: 70 %
Platelet Count: 251 10*3/uL (ref 150–400)
RBC: 4.03 MIL/uL (ref 3.87–5.11)
RDW: 14.5 % (ref 11.5–15.5)
Smear Review: NORMAL
WBC Count: 2.9 10*3/uL — ABNORMAL LOW (ref 4.0–10.5)
nRBC: 0 % (ref 0.0–0.2)

## 2021-02-03 LAB — HIV ANTIBODY (ROUTINE TESTING W REFLEX): HIV Screen 4th Generation wRfx: NONREACTIVE

## 2021-02-03 LAB — LACTATE DEHYDROGENASE: LDH: 332 U/L — ABNORMAL HIGH (ref 98–192)

## 2021-02-03 LAB — SEDIMENTATION RATE: Sed Rate: 67 mm/hr — ABNORMAL HIGH (ref 0–22)

## 2021-02-03 LAB — C-REACTIVE PROTEIN: CRP: 0.8 mg/dL (ref <1.0)

## 2021-02-03 LAB — SAVE SMEAR(SSMR), FOR PROVIDER SLIDE REVIEW

## 2021-02-03 NOTE — Progress Notes (Signed)
[facility] Telephone:([phone]   [phone]  INITIAL CONSULT NOTE  Patient Care Team: Darrow Bussing, MD as PCP - General (Family Medicine)  Hematological/Oncological History # Bilateral Axillary Lymphadenopathy 01/08/2021: patient underwent CT chest w contrast which showed bilateral moderate axillary and subpectoral adenopathy 02/03/2021: establish care with Dr. Leonides Schanz   CHIEF COMPLAINTS/PURPOSE OF CONSULTATION:  " Generalized Lymphadenopathy "  HISTORY OF PRESENTING ILLNESS:  Laura Osborne 35 y.o. female with medical history significant for carpal tunnel syndrome who presents for evaluation of bilateral axillary lymphadenopathy.   On review of the previous records Mr. Novitski underwent a CT scan of her chest after having had fever and shortness of breath for 2 weeks time.  Scan was performed on 01/09/2019 and showed bilateral moderate axillary and subpectoral adenopathy.  Due to concern for these findings patient was referred to hematology for further evaluation management.  On exam today Laura Osborne is accompanied by her mother.  She reports her symptoms began in July 2022 at which time she was having issues with cough and congestion.  They told her to come back in 10 days thinking that if this was a viral infection it would clear up.  After her symptoms persisted she was diagnosed with a severe sinus infection and started on Augmentin.  She had a temperature of 102 at that time.  Subsequently her symptoms did not resolve and she is starting the Z-Pak.  Finally there was suspicion she may have had a tick bite and she is started on doxycycline therapy.  Her symptoms abated but then in September came back when she was having issues with fatigue and tiredness.  She recently been having fevers again and was trialed on doxycycline a second time.  Most recently on 01/09/2019 the patient underwent a CT scan which showed lymphadenopathy for which she presented  today.  She notes that she is not having any issues with joint pain and/or rashes.  She denies any stomach upset with nausea, vomiting, diarrhea, or constipation.  She notes that she is a never smoker and does not drink alcohol.  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  Family history is remarkable for colon cancer in her paternal grandfather and prostate cancer in her maternal grandfather.  She notes that she has not had any fever since her last round of doxycycline.  She is not able to palpate the lymph nodes under her arms.  She is able to palpate a lymph node in the left cervical chain.  She otherwise currently denies any fevers, chills, sweats, nausea, ming or diarrhea.  A full 10 point ROS is listed below.  MEDICAL HISTORY:  Past Medical History:  Diagnosis Date   Bilateral carpal tunnel syndrome 08/14/2017   No pertinent past medical history     SURGICAL HISTORY: Past Surgical History:  Procedure Laterality Date   CERVICAL BIOPSY  W/ LOOP ELECTRODE EXCISION  2017   CIN 3 Clear margins    SOCIAL HISTORY: Social History   Socioeconomic History   Marital status: Single    Spouse name: Not on file   Number of children: 2   Years of education: 14   Highest education level: Not on file  Occupational History   Occupation: [employer]  Tobacco Use   Smoking status: Never   Smokeless tobacco: Never  Vaping Use   Vaping Use: Never used  Substance and Sexual Activity   Alcohol use: Yes    Alcohol/week: 0.0 standard drinks  Comment: Rare   Drug use: No   Sexual activity: Not Currently    Birth control/protection: None    Comment: 1st intercourse- 23, Fewer than 5 partners   Other Topics Concern   Not on file  Social History Narrative   Lives alone w/ children   Caffeine use: Coffee 2-3 times per week   Right handed    Social Determinants of Health   Financial Resource Strain: Not on file  Food Insecurity: Not on file  Transportation Needs: Not on file   Physical Activity: Not on file  Stress: Not on file  Social Connections: Not on file  Intimate Partner Violence: Not on file    FAMILY HISTORY: Family History  Problem Relation Age of Onset   Hypertension Maternal Grandmother    Hypertension Maternal Grandfather    Diabetes Maternal Grandfather    Heart disease Maternal Grandfather    Heart attack Father     ALLERGIES:  has No Known Allergies.  MEDICATIONS:  Current Outpatient Medications  Medication Sig Dispense Refill   b complex vitamins tablet Take 1 tablet by mouth daily.     cetirizine (ZYRTEC) 10 MG tablet Take 10 mg by mouth daily.     Cholecalciferol (VITAMIN D3) 1.25 MG (50000 UT) CAPS Take 1 tablet by mouth once a week.     ferrous sulfate 325 (65 FE) MG tablet Take 325 mg by mouth daily.     hydrOXYzine (ATARAX/VISTARIL) 50 MG tablet Take 100 mg by mouth 2 (two) times daily.     ibuprofen (ADVIL) 200 MG tablet Take 200 mg by mouth every 6 (six) hours as needed.     naproxen (NAPROSYN) 500 MG tablet Take 1 tablet (500 mg total) by mouth 2 (two) times daily. 30 tablet 0   Prenatal Vit-DSS-Fe Fum-FA (PRENATAL 19) 29-1 MG TABS Take by mouth.     No current facility-administered medications for this visit.    REVIEW OF SYSTEMS:   Constitutional: ( - ) fevers, ( - )  chills , ( - ) night sweats Eyes: ( - ) blurriness of vision, ( - ) double vision, ( - ) watery eyes Ears, nose, mouth, throat, and face: ( - ) mucositis, ( - ) sore throat Respiratory: ( - ) cough, ( - ) dyspnea, ( - ) wheezes Cardiovascular: ( - ) palpitation, ( - ) chest discomfort, ( - ) lower extremity swelling Gastrointestinal:  ( - ) nausea, ( - ) heartburn, ( - ) change in bowel habits Skin: ( - ) abnormal skin rashes Lymphatics: ( - ) new lymphadenopathy, ( - ) easy bruising Neurological: ( - ) numbness, ( - ) tingling, ( - ) new weaknesses Behavioral/Psych: ( - ) mood change, ( - ) new changes  All other systems were reviewed with the patient  and are negative.  PHYSICAL EXAMINATION: ECOG PERFORMANCE STATUS: 0 - Asymptomatic  Vitals:   02/03/21 1341  BP: 127/84  Pulse: (!) 102  Resp: 17  Temp: 99.4 F (37.4 C)  SpO2: 100%   Filed Weights   02/03/21 1341  Weight: 146 lb 4 oz (66.3 kg)    GENERAL: well appearing young African-American female in NAD  SKIN: skin color, texture, turgor are normal, no rashes or significant lesions EYES: conjunctiva are pink and non-injected, sclera clear NECK: supple, non-tender LYMPH: Palpable lymphadenopathy with large lymph node in the left cervical chain as well as easily palpable lymph nodes under right axilla.  Unable to appreciate left axilla  lymph nodes.  Otherwise no palpable lymphadenopathy in the cervical, axillary or supraclavicular lymph nodes.  LUNGS: clear to auscultation and percussion with normal breathing effort HEART: regular rate & rhythm and no murmurs and no lower extremity edema ABDOMEN: soft, non-tender, non-distended, normal bowel sounds.  No hepatosplenomegaly appreciated. Musculoskeletal: no cyanosis of digits and no clubbing  PSYCH: alert & oriented x 3, fluent speech NEURO: no focal motor/sensory deficits  LABORATORY DATA:  I have reviewed the data as listed CBC Latest Ref Rng & Units 02/03/2021 12/23/2020 10/02/2020  WBC 4.0 - 10.5 K/uL 2.9(L) 4.8 4.0  Hemoglobin 12.0 - 15.0 g/dL 10.8(L) 12.6 12.5  Hematocrit 36.0 - 46.0 % 32.6(L) 38.4 38.2  Platelets 150 - 400 K/uL 251 275 256    CMP Latest Ref Rng & Units 02/03/2021 12/23/2020 10/02/2020  Glucose 70 - 99 mg/dL 83 85 83  BUN 6 - 20 mg/dL 9 9 17   Creatinine 0.44 - 1.00 mg/dL 0.64 0.68 1.05(H)  Sodium 135 - 145 mmol/L 138 134(L) 139  Potassium 3.5 - 5.1 mmol/L 3.7 3.3(L) 4.0  Chloride 98 - 111 mmol/L 104 102 108  CO2 22 - 32 mmol/L 25 21(L) 20(L)  Calcium 8.9 - 10.3 mg/dL 9.2 9.8 8.7(L)  Total Protein 6.5 - 8.1 g/dL 8.3(H) 8.7(H) -  Total Bilirubin 0.3 - 1.2 mg/dL 0.3 0.4 -  Alkaline Phos 38 - 126 U/L 64 61  -  AST 15 - 41 U/L 36 39 -  ALT 0 - 44 U/L 27 28 -    RADIOGRAPHIC STUDIES: I have personally reviewed the radiological images as listed and agreed with the findings in the report: bilateral axillary adenopathy.  CT CHEST W CONTRAST  Result Date: 01/08/2021 CLINICAL DATA:  Shortness of breath for 2 weeks. Fever for 2 weeks. Nonsmoker. Negative D-dimer in COVID-19 test. EXAM: CT CHEST WITH CONTRAST TECHNIQUE: Multidetector CT imaging of the chest was performed during intravenous contrast administration. CONTRAST:  52mL OMNIPAQUE IOHEXOL 300 MG/ML  SOLN COMPARISON:  Plain film 12/23/2020 FINDINGS: Cardiovascular: Normal aortic caliber. Mild cardiomegaly, without pericardial effusion. No central pulmonary embolism, on this non-dedicated study. Mediastinum/Nodes: Multiple small bilateral low jugular and supraclavicular nodes. Bilateral axillary adenopathy by both size and number. Index right axillary node measures 1.1 x 2.0 cm on 40/2. Index left axillary node measures 1.2 x 1.5 cm on 36/2. Subpectoral nodes including at up to 7 mm on the left on 35/2. No mediastinal or hilar adenopathy. Soft tissue density in the anterior mediastinum is likely residual thymus. Lungs/Pleura: Trace bilateral pleural fluid. Bibasilar subsegmental atelectasis. Scattered calcified granulomas. Upper Abdomen: Normal imaged portions of the liver, stomach, pancreas, gallbladder, adrenal glands, kidneys. Splenic calcification likely relates to old granulomatous disease. Musculoskeletal: No acute osseous abnormality. IMPRESSION: 1. Bilateral moderate axillary and subpectoral adenopathy. Although findings could be reactive in the setting of a chronic infectious or inflammatory process such as HIV, lymphoproliferative process is is suspected, given clinical history. 2. Trace bilateral pleural fluid. Electronically Signed   By: Abigail Miyamoto M.D.   On: 01/08/2021 17:55    ASSESSMENT & PLAN Laura Osborne 35 y.o. female with medical  history significant for carpal tunnel syndrome who presents for evaluation of bilateral axillary lymphadenopathy.   After review of the labs, review of the records, and discussion with the patient the patients findings are most consistent with bilateral axillary lymphadenopathy and left cervical lymphadenopathy of unclear etiology.  At this time we will order blood work to include inflammatory markers.  I  am concerned that this may represent a hematological malignancy given his persistent and recent drop in blood counts.  Therefore I recommend referral to ENT for consideration of excisional biopsy of cervical lymph node.  The patient voiced understanding of this plan moving forward.  # Bilateral Axillary Lymphadenopathy -- Findings are concerning for hematological malignancy.  Would recommend pursuing excisional biopsy of cervical lymph node with ENT --We will hold on further imaging until tissue biopsy can be obtained --We will order CBC, CMP, inflammatory markers today --Flow cytometry ordered as well as peripheral blood film --Placeholder appointment scheduled in 2 months time  Orders Placed This Encounter  Procedures   CBC with Differential ([place] Only)    Standing Status:   Future    Number of Occurrences:   1    Standing Expiration Date:   02/03/2022   CMP ([place] only)    Standing Status:   Future    Number of Occurrences:   1    Standing Expiration Date:   02/03/2022   Lactate dehydrogenase (LDH)    Standing Status:   Future    Number of Occurrences:   1    Standing Expiration Date:   02/03/2022   Sedimentation rate    Standing Status:   Future    Number of Occurrences:   1    Standing Expiration Date:   02/03/2022   C-reactive protein    Standing Status:   Future    Number of Occurrences:   1    Standing Expiration Date:   02/03/2022   Save Smear (SSMR)    Standing Status:   Future    Number of Occurrences:   1    Standing Expiration Date:   02/03/2022   Flow  Cytometry, Peripheral Blood (Oncology)    Standing Status:   Future    Number of Occurrences:   1    Standing Expiration Date:   02/03/2022   HIV antibody (with reflex)    Standing Status:   Future    Number of Occurrences:   1    Standing Expiration Date:   02/03/2022   Ambulatory referral to ENT    Referral Priority:   Routine    Referral Type:   Consultation    Referral Reason:   Specialty Services Required    Referred to Provider:   Izora Gala, MD    Requested Specialty:   Otolaryngology    Number of Visits Requested:   1    All questions were answered. The patient knows to call the clinic with any problems, questions or concerns.  A total of more than 60 minutes were spent on this encounter with face-to-face time and non-face-to-face time, including preparing to see the patient, ordering tests and/or medications, counseling the patient and coordination of care as outlined above.   Ledell Peoples, MD Department of Hematology/Oncology [place] [facility] Phone: [phone] Pager: [phone] Email: [email or web address]  02/03/2021 4:10 PM

## 2021-02-05 LAB — SURGICAL PATHOLOGY

## 2021-02-08 ENCOUNTER — Other Ambulatory Visit: Payer: Self-pay | Admitting: Hematology and Oncology

## 2021-02-08 ENCOUNTER — Telehealth: Payer: Self-pay

## 2021-02-08 DIAGNOSIS — R590 Localized enlarged lymph nodes: Secondary | ICD-10-CM

## 2021-02-08 LAB — FLOW CYTOMETRY

## 2021-02-08 NOTE — Telephone Encounter (Addendum)
pt left a message with FYI she has been having chills, headache, body aches, fatigue a fever since Saturday.  Pt is also wanting lab results from 11/9. Please advise.  Per Dr Leonides Schanz: Please let her know that the bloodwork showed increased inflammation, but did not give [place] a definitive diagnosis. The next best step is the lymph node biopsy. Please assure that she has connected with ENT. If symptoms worsening recommend going to the ER.  Pt advised of lab results and to go to the ER if s/s worsen. Her temp is dropping with ibuprofen.  Down today 100.8 from 102 on Saturday. Pt with [facility].  She has not heard from ENT, Referral and last office note faxed to [phone]. Confirmation received.  Pt is requesting a new referral to Dr Matthias Hughs  rather than Pollyann Kennedy. Faxed and confirmation received.

## 2021-02-10 ENCOUNTER — Emergency Department (HOSPITAL_COMMUNITY): Payer: Managed Care, Other (non HMO)

## 2021-02-10 ENCOUNTER — Encounter (HOSPITAL_COMMUNITY): Payer: Self-pay

## 2021-02-10 ENCOUNTER — Emergency Department (HOSPITAL_COMMUNITY)
Admission: EM | Admit: 2021-02-10 | Discharge: 2021-02-10 | Disposition: A | Discharge status: Home / Self Care | Payer: Managed Care, Other (non HMO) | Attending: Emergency Medicine | Admitting: Emergency Medicine

## 2021-02-10 DIAGNOSIS — R590 Localized enlarged lymph nodes: Secondary | ICD-10-CM | POA: Insufficient documentation

## 2021-02-10 DIAGNOSIS — B349 Viral infection, unspecified: Secondary | ICD-10-CM | POA: Insufficient documentation

## 2021-02-10 DIAGNOSIS — R509 Fever, unspecified: Secondary | ICD-10-CM | POA: Diagnosis present

## 2021-02-10 DIAGNOSIS — D72819 Decreased white blood cell count, unspecified: Secondary | ICD-10-CM | POA: Diagnosis not present

## 2021-02-10 DIAGNOSIS — Z20822 Contact with and (suspected) exposure to covid-19: Secondary | ICD-10-CM | POA: Diagnosis not present

## 2021-02-10 LAB — CBC WITH DIFFERENTIAL/PLATELET
Abs Immature Granulocytes: 0.01 10*3/uL (ref 0.00–0.07)
Basophils Absolute: 0 10*3/uL (ref 0.0–0.1)
Basophils Relative: 0 %
Eosinophils Absolute: 0 10*3/uL (ref 0.0–0.5)
Eosinophils Relative: 0 %
HCT: 35 % — ABNORMAL LOW (ref 36.0–46.0)
Hemoglobin: 11.6 g/dL — ABNORMAL LOW (ref 12.0–15.0)
Immature Granulocytes: 0 %
Lymphocytes Relative: 14 %
Lymphs Abs: 0.4 10*3/uL — ABNORMAL LOW (ref 0.7–4.0)
MCH: 27.2 pg (ref 26.0–34.0)
MCHC: 33.1 g/dL (ref 30.0–36.0)
MCV: 82 fL (ref 80.0–100.0)
Monocytes Absolute: 0.1 10*3/uL (ref 0.1–1.0)
Monocytes Relative: 5 %
Neutro Abs: 2.5 10*3/uL (ref 1.7–7.7)
Neutrophils Relative %: 81 %
Platelets: 175 10*3/uL (ref 150–400)
RBC: 4.27 MIL/uL (ref 3.87–5.11)
RDW: 14.4 % (ref 11.5–15.5)
WBC: 3.1 10*3/uL — ABNORMAL LOW (ref 4.0–10.5)
nRBC: 0 % (ref 0.0–0.2)

## 2021-02-10 LAB — URINALYSIS, ROUTINE W REFLEX MICROSCOPIC
Bilirubin Urine: NEGATIVE
Glucose, UA: NEGATIVE mg/dL
Hgb urine dipstick: NEGATIVE
Ketones, ur: 5 mg/dL — AB
Leukocytes,Ua: NEGATIVE
Nitrite: NEGATIVE
Protein, ur: 100 mg/dL — AB
Specific Gravity, Urine: 1.018 (ref 1.005–1.030)
pH: 5 (ref 5.0–8.0)

## 2021-02-10 LAB — COMPREHENSIVE METABOLIC PANEL
ALT: 38 U/L (ref 0–44)
AST: 68 U/L — ABNORMAL HIGH (ref 15–41)
Albumin: 3.6 g/dL (ref 3.5–5.0)
Alkaline Phosphatase: 49 U/L (ref 38–126)
Anion gap: 9 (ref 5–15)
BUN: 15 mg/dL (ref 6–20)
CO2: 22 mmol/L (ref 22–32)
Calcium: 8.9 mg/dL (ref 8.9–10.3)
Chloride: 101 mmol/L (ref 98–111)
Creatinine, Ser: 0.73 mg/dL (ref 0.44–1.00)
GFR, Estimated: >60 mL/min (ref >60)
Glucose, Bld: 93 mg/dL (ref 70–99)
Potassium: 3.7 mmol/L (ref 3.5–5.1)
Sodium: 132 mmol/L — ABNORMAL LOW (ref 135–145)
Total Bilirubin: 0.6 mg/dL (ref 0.3–1.2)
Total Protein: 8.6 g/dL — ABNORMAL HIGH (ref 6.5–8.1)

## 2021-02-10 LAB — RESP PANEL BY RT-PCR (FLU A&B, COVID) ARPGX2
Influenza A by PCR: NEGATIVE
Influenza B by PCR: NEGATIVE
SARS Coronavirus 2 by RT PCR: NEGATIVE

## 2021-02-10 LAB — I-STAT BETA HCG BLOOD, ED (MC, WL, AP ONLY): I-stat hCG, quantitative: <5 m[IU]/mL (ref <5)

## 2021-02-10 MED ORDER — ACETAMINOPHEN 325 MG PO TABS
650.0000 mg | ORAL_TABLET | Freq: Once | ORAL | Status: AC
  Administered 2021-02-10: 650 mg via ORAL
  Filled 2021-02-10: qty 2

## 2021-02-10 NOTE — Discharge Instructions (Signed)
Please follow up with both your PCP and your hematologist/oncologist regarding ED visit today.  We have sent out a respiratory panel that they can follow up on.   Continue alternating Ibuprofen and Tylenol for your fever  Follow up with ENT for plans for biopsy of your lymph node in your neck  Return to the ED for any new/worsening symptoms

## 2021-02-10 NOTE — ED Provider Notes (Signed)
MSE note.  Patient complains of 2-day history of fever.  Patient has a history of a swollen lymph node on her neck and having fevers before.  She sees a hematologist who is going to get a biopsy of her cervical lymph node.  Physical exam mild swelling and tenderness left cervical node.  Lungs clear heart regular rate and rhythm.  Patient will get CBC chemistries and COVID and flu test.  She is also given some Tylenol   Bethann Berkshire, MD 02/10/21 [phone]

## 2021-02-10 NOTE — ED Triage Notes (Signed)
Pt presents with c/o fever and generalized body aches. Pt reports the highest fever she had at home was 103.9. Pt does report an issue with her lymph nodes that she has seen a hematologist for in the past.

## 2021-02-10 NOTE — ED Provider Notes (Signed)
[facility]-EMERGENCY DEPT Provider Note   CSN: [id number] Arrival date & time: 02/10/21  1545     History Chief Complaint  Patient presents with   Fever   Generalized Body Aches    Laura Osborne is a 35 y.o. female who presents to the ED today with complaint of fevers with tmax 103.9 that began 5 days ago. Pt also complains of a dry cough, body aches, and chills. She reports hx of left cervical lymphadenopathy for the past month and is following hematology for same. Per chart review pt has been following heme onc for bilateral axillary lymphadenopathy with intermittent fevers. She had a CT chest done in October of this year which showed bilateral moderate axillary and subpectoral adenopathy. She then began noticing left cervical lymphadenopathy about 1 month ago and recently saw heme onc for same. There is concern for hematological malignancy and they plan to have excisional biopsy of cervical lymphnode with ENT. Pt states that she has never had fevers as high as 103.9 which caused her concern today. She has been taking Ibuprofen today for her fever. She denies any recent sick contacts. She denies headache, sore throat, ear pain, SOB, abdominal pain, vomiting, diarrhea, urinary symptoms.   HISTORY OF PRESENTING ILLNESS Heme/Onc visit on 02/03/21 Laura Osborne 35 y.o. female with medical history significant for carpal tunnel syndrome who presents for evaluation of bilateral axillary lymphadenopathy.    On review of the previous records Mr. Upham underwent a CT scan of her chest after having had fever and shortness of breath for 2 weeks time.  Scan was performed on 01/09/2019 and showed bilateral moderate axillary and subpectoral adenopathy.  Due to concern for these findings patient was referred to hematology for further evaluation management.   On exam today Laura Osborne is accompanied by her mother.  She reports her symptoms began in July 2022 at which time she  was having issues with cough and congestion.  They told her to come back in 10 days thinking that if this was a viral infection it would clear up.  After her symptoms persisted she was diagnosed with a severe sinus infection and started on Augmentin.  She had a temperature of 102 at that time.  Subsequently her symptoms did not resolve and she is starting the Z-Pak.  Finally there was suspicion she may have had a tick bite and she is started on doxycycline therapy.  Her symptoms abated but then in September came back when she was having issues with fatigue and tiredness.  She recently been having fevers again and was trialed on doxycycline a second time.  Most recently on 01/09/2019 the patient underwent a CT scan which showed lymphadenopathy for which she presented today.   She notes that she is not having any issues with joint pain and/or rashes.  She denies any stomach upset with nausea, vomiting, diarrhea, or constipation.  She notes that she is a never smoker and does not drink alcohol.  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  Family history is remarkable for colon cancer in her paternal grandfather and prostate cancer in her maternal grandfather.  She notes that she has not had any fever since her last round of doxycycline.  She is not able to palpate the lymph nodes under her arms.  She is able to palpate a lymph node in the left cervical chain.  She otherwise currently denies any fevers, chills, sweats, nausea, ming or diarrhea.  A  full 10 point ROS is listed below.  The history is provided by the patient and medical records.      Past Medical History:  Diagnosis Date   Bilateral carpal tunnel syndrome 08/14/2017   No pertinent past medical history     Patient Active Problem List   Diagnosis Date Noted   Encntr for gyn exam (general) (routine) w/o abn findings 01/16/2020   Bilateral carpal tunnel syndrome 08/14/2017   CIN III (cervical intraepithelial neoplasia grade III)  with severe dysplasia 10/12/2015   Nexplanon in place 05/13/2015    Past Surgical History:  Procedure Laterality Date   CERVICAL BIOPSY  W/ LOOP ELECTRODE EXCISION  2017   CIN 3 Clear margins     OB History     Gravida  2   Para  2   Term  2   Preterm      AB      Living  2      SAB      IAB      Ectopic      Multiple      Live Births  2           Family History  Problem Relation Age of Onset   Hypertension Maternal Grandmother    Hypertension Maternal Grandfather    Diabetes Maternal Grandfather    Heart disease Maternal Grandfather    Heart attack Father     Social History   Tobacco Use   Smoking status: Never   Smokeless tobacco: Never  Vaping Use   Vaping Use: Never used  Substance Use Topics   Alcohol use: Yes    Alcohol/week: 0.0 standard drinks    Comment: Rare   Drug use: No    Home Medications Prior to Admission medications   Medication Sig Start Date End Date Taking? Authorizing Provider  b complex vitamins tablet Take 1 tablet by mouth daily.   Yes [provider]  cetirizine (ZYRTEC) 10 MG tablet Take 10 mg by mouth daily.   Yes [provider]  Cholecalciferol (VITAMIN D3) 1.25 MG (50000 UT) CAPS Take 50,000 Units by mouth every Wednesday. 12/21/20  Yes [provider]  ferrous sulfate 325 (65 FE) MG tablet Take 325 mg by mouth daily with breakfast. 12/17/20  Yes [provider]  fluticasone (FLONASE) 50 MCG/ACT nasal spray Place 1-2 sprays into both nostrils daily as needed (for seasonal allergies or rhinitis).   Yes [provider]  hydrOXYzine (ATARAX/VISTARIL) 50 MG tablet Take 100 mg by mouth 2 (two) times daily as needed for anxiety. 12/24/20  Yes [provider]  ibuprofen (ADVIL) 200 MG tablet Take 800 mg by mouth every 6 (six) hours as needed (for pain/discomfort, fevers, and/or headaches).   Yes [provider]  Prenatal Vit-DSS-Fe Fum-FA (PRENATAL 19) 29-1 MG  TABS Take 1 tablet by mouth daily with breakfast.   Yes [provider]  naproxen (NAPROSYN) 500 MG tablet Take 1 tablet (500 mg total) by mouth 2 (two) times daily. Patient not taking: No sig reported 08/04/20   Wieters, Corley C, PA-C    Allergies    Patient has no known allergies.  Review of Systems   Review of Systems  Constitutional:  Positive for chills, fatigue and fever.  HENT:  Negative for ear pain and sore throat.   Respiratory:  Positive for cough. Negative for shortness of breath.   Gastrointestinal:  Positive for nausea. Negative for abdominal pain, diarrhea and vomiting.  Genitourinary:  Negative for dysuria, flank pain and frequency.  Neurological:  Negative for headaches.  All other systems reviewed and are negative.  Physical Exam Updated Vital Signs BP 108/78 (BP Location: Left Arm)   Pulse (!) 104   Temp 99.9 F (37.7 C) (Oral)   Resp 17   Ht 5\' 4"  (1.626 m)   Wt 65.8 kg   LMP 02/10/2021 (Approximate)   SpO2 97%   BMI 24.89 kg/m   Physical Exam Vitals and nursing note reviewed.  Constitutional:      Appearance: She is not ill-appearing or diaphoretic.  HENT:     Head: Normocephalic and atraumatic.     Right Ear: Tympanic membrane normal.     Left Ear: Tympanic membrane normal.     Mouth/Throat:     Mouth: Mucous membranes are moist.     Pharynx: No oropharyngeal exudate or posterior oropharyngeal erythema.  Eyes:     Conjunctiva/sclera: Conjunctivae normal.  Neck:     Comments: Large left cervical lymphnode enlargement with TTP Cardiovascular:     Rate and Rhythm: Normal rate and regular rhythm.     Pulses: Normal pulses.  Pulmonary:     Effort: Pulmonary effort is normal.     Breath sounds: Normal breath sounds. No wheezing, rhonchi or rales.  Abdominal:     Palpations: Abdomen is soft.     Tenderness: There is no abdominal tenderness. There is no guarding or rebound.  Musculoskeletal:     Cervical back: Neck supple.   Lymphadenopathy:     Cervical: Cervical adenopathy present.  Skin:    General: Skin is warm and dry.  Neurological:     Mental Status: She is alert.    ED Results / Procedures / Treatments   Labs (all labs ordered are listed, but only abnormal results are displayed) Labs Reviewed  COMPREHENSIVE METABOLIC PANEL - Abnormal; Notable for the following components:      Result Value   Sodium 132 (*)    Total Protein 8.6 (*)    AST 68 (*)    All other components within normal limits  CBC WITH DIFFERENTIAL/PLATELET - Abnormal; Notable for the following components:   WBC 3.1 (*)    Hemoglobin 11.6 (*)    HCT 35.0 (*)    All other components within normal limits  URINALYSIS, ROUTINE W REFLEX MICROSCOPIC - Abnormal; Notable for the following components:   Ketones, ur 5 (*)    Protein, ur 100 (*)    Bacteria, UA RARE (*)    All other components within normal limits  RESP PANEL BY RT-PCR (FLU A&B, COVID) ARPGX2  RESPIRATORY PANEL BY PCR  I-STAT BETA HCG BLOOD, ED ([facility], [facility], [facility] ONLY)    EKG None  Radiology DG Chest 2 View  Result Date: 02/10/2021 CLINICAL DATA:  Cough, fever EXAM: CHEST - 2 VIEW COMPARISON:  12/23/2020 FINDINGS: Linear bibasilar atelectasis. No effusions. Heart is normal size. No acute bony abnormality. IMPRESSION: Bibasilar atelectasis. Electronically Signed   By: 12/25/2020 M.D.   On: 02/10/2021 17:19    Procedures Procedures   Medications Ordered in ED Medications  acetaminophen (TYLENOL) tablet 650 mg (650 mg Oral Given 02/10/21 1642)    ED Course  I have reviewed the triage vital signs and the nursing notes.  Pertinent labs & imaging results that were available during my care of the patient were reviewed by me and considered in my medical decision making (see chart for details).    MDM Rules/Calculators/A&P  35 year old female who presents to the ED today with complaint of fevers for the past 5 days.  Has been following  heme-onc for bilateral axillary lymphadenopathy as well as left cervical lymphadenopathy for the past several months.  Plans to have a biopsy of her cervical lymph node in December.  On arrival to the ED today patient's temperature is 99.9.  Last took ibuprofen earlier today.  She is mildly tachycardic at 104.  Remainder of vitals unremarkable.  On my exam she is resting comfortably in the chair.  She is noted to have a palpable left cervical lymph node with tenderness palpation.  Unchanged from previous heme-onc visit on 11/09.  He does mention a new dry cough that began yesterday.  She denies any recent sick contacts.  She is tolerating her own secretions without difficulty.  Denies any sore throat.  Her abdomen is soft and nontender.  Denies any urinary symptoms.  We will plan for labs at this time including CBC, CMP, swab for COVID/flu.  We will also obtain a urinalysis and chest x-ray to rule out other sources of infection.  She denies any concern for tick exposure today.  CBC with leukopenia 3.1. Hgb stable at 11.6 and improved from previous.   WBC  Date Value Ref Range Status  02/10/2021 3.1 (L) 4.0 - 10.5 K/uL Final  12/23/2020 4.8 4.0 - 10.5 K/uL Final  10/02/2020 4.0 4.0 - 10.5 K/uL Final  01/16/2020 4.4 3.8 - 10.8 Thousand/uL Final   WBC Count  Date Value Ref Range Status  02/03/2021 2.9 (L) 4.0 - 10.5 K/uL Final   CMP with sodium 132 and AST 68. No other electrolyte abnormalities. Abd soft and nontender; low suspicion for gallbladder etiology.  COVID and flu negative CXR clear U/A without signs of infection  Workup overall reassuring at this time. Given new onset cough will plan for respiratory panel to send out. Have discussed case with attending physician Dr. Silverio Lay who agrees with plan. Do not feel pt requires additional further workup in the ED today. Suspect viral illness at this time however pt encouraged to follow up with heme onc/ENT for biopsy of her cervical lymphnode. She is  in agreement with plan and stable for discharge home.   This note was prepared using Dragon voice recognition software and may include unintentional dictation errors due to the inherent limitations of voice recognition software.   Final Clinical Impression(s) / ED Diagnoses Final diagnoses:  Fever of unknown origin  Viral illness    Rx / DC Orders ED Discharge Orders     None        Discharge Instructions      Please follow up with both your PCP and your hematologist/oncologist regarding ED visit today.  We have sent out a respiratory panel that they can follow up on.   Continue alternating Ibuprofen and Tylenol for your fever  Follow up with ENT for plans for biopsy of your lymph node in your neck  Return to the ED for any new/worsening symptoms       Tanda Rockers, PA-C 02/10/21 1802    Charlynne Pander, MD 02/10/21 [phone]

## 2021-02-11 LAB — RESPIRATORY PANEL BY PCR

## 2021-02-16 ENCOUNTER — Telehealth: Payer: Self-pay

## 2021-02-16 NOTE — Telephone Encounter (Signed)
Pt left a message stating she cannot get in to see an ENT until 03/16/21 for a biopsy, therefore she wants to be admitted and have a hospital surgeon do the bx to expedite the process. Pt states she has ongoing fevers and doesn't feel well.

## 2021-02-16 NOTE — Telephone Encounter (Signed)
I called Dr. Lucky Rathke office and they were able to move pts appt to 02/09/21 with a 9:45am arrival. Pt has also been placed on their cancellation list.  I spoke with pt and advised of this. She expressed understanding of this information.

## 2021-02-24 ENCOUNTER — Telehealth: Payer: Self-pay | Admitting: *Deleted

## 2021-02-24 NOTE — Telephone Encounter (Signed)
Received call from pt. She is asking if she can have a letter keeping her out of work until she has a lymph node biopsy. She has periodic fevers for an unknown cause. Spoke with her and advised that she should talk to her HR department at work and ask them about intermittant FMLA. Pt does not have fevers every day and there is not other reason to keep her out of work completely. She has an appt with Dr. Pollyann Kennedy to discuss lymph node biopsy on 12/15/2  Advised that she likley will not have her biopsy on the same day.  So it is unclear when to "end" the FMLA/intermittant or otherwise. Encouraged pt to go to work when she feels well. Advised that the biopsy will not change her s/s but will give [place] added information. Pt voiced understanding and will contact her HR Department.

## 2021-03-11 DIAGNOSIS — R590 Localized enlarged lymph nodes: Secondary | ICD-10-CM | POA: Insufficient documentation

## 2021-03-11 DIAGNOSIS — R509 Fever, unspecified: Secondary | ICD-10-CM | POA: Insufficient documentation

## 2021-04-05 ENCOUNTER — Inpatient Hospital Stay: Payer: Managed Care, Other (non HMO)

## 2021-04-05 ENCOUNTER — Inpatient Hospital Stay: Payer: Managed Care, Other (non HMO) | Admitting: Hematology and Oncology

## 2021-04-07 ENCOUNTER — Encounter (HOSPITAL_BASED_OUTPATIENT_CLINIC_OR_DEPARTMENT_OTHER): Payer: Self-pay | Admitting: Otolaryngology

## 2021-04-07 ENCOUNTER — Other Ambulatory Visit: Payer: Self-pay

## 2021-04-14 NOTE — H&P (Signed)
HPI:   Laura Osborne is a 36 y.o. female who presents as a new Patient.   Referring Provider: Self, A Referral  Chief complaint: Swollen lymph nodes.  HPI: Couple month history of fevers of unknown origin and swollen lymph nodes in her neck. She had a CT scan of the chest which revealed axillary and subpectoral lymph nodes enlarged as well. She has been seen by hematology and the source of her fevers has not been identified. She is here for evaluation of the cervical lymph nodes.  PMH/Meds/All/SocHx/FamHx/ROS:   History reviewed. No pertinent past medical history.  Past Surgical History:  Procedure Laterality Date   NO PAST SURGERIES   No family history of bleeding disorders, wound healing problems or difficulty with anesthesia.   Social History   Socioeconomic History   Marital status: Single  Spouse name: Not on file   Number of children: Not on file   Years of education: Not on file   Highest education level: Not on file  Occupational History   Not on file  Tobacco Use   Smoking status: Never   Smokeless tobacco: Never  Vaping Use   Vaping Use: Never used  Substance and Sexual Activity   Alcohol use: Yes   Drug use: Not on file   Sexual activity: Not on file  Other Topics Concern   Not on file  Social History Narrative   Not on file   Social Determinants of Health   Financial Resource Strain: Not on file  Food Insecurity: Not on file  Transportation Needs: Not on file  Physical Activity: Not on file  Stress: Not on file  Social Connections: Not on file  Housing Stability: Not on file   Current Outpatient Medications:   cetirizine (ZYRTEC) 10 MG tablet, Take 10 mg by mouth daily., Disp: , Rfl:   cholecalciferol, vitamin D3, 1,250 mcg (50,000 unit) capsule, 1 TABLET ORALLY ONCE A WEEK, Disp: , Rfl:   hydrOXYzine HCL (ATARAX) 50 MG tablet, , Disp: , Rfl:   mupirocin (BACTROBAN) 2 % ointment, Apply to nostrils 3 times daily, Disp: 22 g, Rfl: 0  PNV 119-iron  fum-folic acid 29 mg iron- 1 mg Tab, Take 1 tablet by mouth daily with breakfast., Disp: , Rfl:   A complete ROS was performed with pertinent positives/negatives noted in the HPI. The remainder of the ROS are negative.   Physical Exam:   BP 122/81   Pulse 107   Temp 98.1 F (36.7 C)   Ht 1.626 m (5\' 4" )   Wt 64.4 kg (142 lb)   BMI 24.37 kg/m   General: Healthy and alert, in no distress, breathing easily. Normal affect. In a pleasant mood. Head: Normocephalic, atraumatic. No masses, or scars. Eyes: Pupils are equal, and reactive to light. Vision is grossly intact. No spontaneous or gaze nystagmus. Ears: Ear canals are clear. Tympanic membranes are intact, with normal landmarks and the middle ears are clear and healthy. Hearing: Grossly normal. Nose: Nasal cavities are clear with healthy mucosa, no polyps or exudate. Airways are patent. Face: No masses or scars, facial nerve function is symmetric. Oral Cavity: No mucosal abnormalities are noted. Tongue with normal mobility. Dentition appears healthy. Oropharynx: Tonsils are symmetric. There are no mucosal masses identified. Tongue base appears normal and healthy. Larynx/Hypopharynx: deferred Chest: Deferred Neck: Multiple enlarged lymph nodes involving left supraclavicular area, left level 2, left level 5, submental, possible right level 2. There may be a tiny isthmus nodule in the thyroid as well.  Neuro: Cranial nerves II-XII with normal function. Balance: Normal gate. Other findings: none.  Independent Review of Additional Tests or Records:  CT chest reviewed:  IMPRESSION:  1. Bilateral moderate axillary and subpectoral adenopathy. Although  findings could be reactive in the setting of a chronic infectious or  inflammatory process such as HIV, lymphoproliferative process is is  suspected, given clinical history.  2. Trace bilateral pleural fluid.   Procedures:  Procedure Note:  Indications for procedure: neck mass, left  supraclavicular  Details of the procedure were discussed with the patient and all questions were answered.  Procedure:  2% xylocaine with epinephrine was infiltrated into the overlying skin. First pass was made with a 25 gauge needle and 10 cc syringe. Second pass was made with a 22 gauge needle. Specimen was placed on microscopic slides and air-dried. Additional material was placed in Cytolyte solution for cell block preparation. A third pass was made with a 22 guage needle and sample was added to the cytolyte solution.  A bandage was applied.   She tolerated the procedure well. Results will be discussed when available.  Impression & Plans:  Diffuse cervical lymphadenopathy with associated axillary lymphadenopathy. This is concerning for lymphoproliferative process. FNA performed today. We will discuss results when available.

## 2021-04-18 ENCOUNTER — Encounter (HOSPITAL_COMMUNITY): Payer: Self-pay | Admitting: Anesthesiology

## 2021-04-18 NOTE — Anesthesia Preprocedure Evaluation (Deleted)
Anesthesia Evaluation    Reviewed: Allergy & Precautions, H&P , Patient's Chart, lab work & pertinent test results  Airway        Dental   Pulmonary neg pulmonary ROS,           Cardiovascular Exercise Tolerance: Good negative cardio ROS       Neuro/Psych  Neuromuscular disease negative psych ROS   GI/Hepatic negative GI ROS, Neg liver ROS,   Endo/Other  negative endocrine ROS  Renal/GU negative Renal ROS  negative genitourinary   Musculoskeletal   Abdominal   Peds  Hematology  (+) Blood dyscrasia, anemia ,   Anesthesia Other Findings   Reproductive/Obstetrics negative OB ROS                             Anesthesia Physical Anesthesia Plan  ASA: 2  Anesthesia Plan: General   Post-op Pain Management: Minimal or no pain anticipated   Induction: Intravenous  PONV Risk Score and Plan: 3 and Ondansetron, Dexamethasone and Treatment may vary due to age or medical condition  Airway Management Planned: Oral ETT and LMA  Additional Equipment: None  Intra-op Plan:   Post-operative Plan: Extubation in OR  Informed Consent: I have reviewed the patients History and Physical, chart, labs and discussed the procedure including the risks, benefits and alternatives for the proposed anesthesia with the patient or authorized representative who has indicated his/her understanding and acceptance.       Plan Discussed with: Anesthesiologist and CRNA  Anesthesia Plan Comments: (  )        Anesthesia Quick Evaluation

## 2021-04-19 ENCOUNTER — Ambulatory Visit (HOSPITAL_BASED_OUTPATIENT_CLINIC_OR_DEPARTMENT_OTHER)
Admission: RE | Admit: 2021-04-19 | Payer: Managed Care, Other (non HMO) | Source: Home / Self Care | Admitting: Otolaryngology

## 2021-04-19 HISTORY — DX: Anemia, unspecified: D64.9

## 2021-04-19 HISTORY — DX: Vitamin D deficiency, unspecified: E55.9

## 2021-04-19 SURGERY — LYMPH NODE BIOPSY
Anesthesia: General | Laterality: Left

## 2021-04-19 MED ORDER — BACITRACIN ZINC 500 UNIT/GM EX OINT
TOPICAL_OINTMENT | CUTANEOUS | Status: AC
  Filled 2021-04-19: qty 28.35

## 2021-04-19 MED ORDER — LIDOCAINE-EPINEPHRINE 1 %-1:100000 IJ SOLN
INTRAMUSCULAR | Status: AC
  Filled 2021-04-19: qty 1

## 2021-04-19 MED ORDER — OXYMETAZOLINE HCL 0.05 % NA SOLN
NASAL | Status: AC
  Filled 2021-04-19: qty 30

## 2021-04-28 ENCOUNTER — Inpatient Hospital Stay: Payer: Managed Care, Other (non HMO)

## 2021-04-28 ENCOUNTER — Inpatient Hospital Stay: Payer: Managed Care, Other (non HMO) | Admitting: Hematology and Oncology

## 2021-04-28 ENCOUNTER — Telehealth: Payer: Self-pay | Admitting: Hematology and Oncology

## 2021-04-28 ENCOUNTER — Ambulatory Visit: Payer: Managed Care, Other (non HMO) | Admitting: Hematology and Oncology

## 2021-04-28 ENCOUNTER — Other Ambulatory Visit: Payer: Managed Care, Other (non HMO)

## 2021-04-28 NOTE — Telephone Encounter (Signed)
Sch per 1/27 inbasket, pt aware °

## 2021-05-17 ENCOUNTER — Encounter: Payer: Self-pay | Admitting: Obstetrics & Gynecology

## 2021-05-17 ENCOUNTER — Ambulatory Visit (INDEPENDENT_AMBULATORY_CARE_PROVIDER_SITE_OTHER): Payer: Managed Care, Other (non HMO) | Admitting: Obstetrics & Gynecology

## 2021-05-17 ENCOUNTER — Other Ambulatory Visit: Payer: Self-pay

## 2021-05-17 ENCOUNTER — Other Ambulatory Visit (HOSPITAL_COMMUNITY)
Admission: RE | Admit: 2021-05-17 | Discharge: 2021-05-17 | Disposition: A | Discharge status: Home / Self Care | Payer: Managed Care, Other (non HMO) | Source: Ambulatory Visit | Attending: Obstetrics & Gynecology | Admitting: Obstetrics & Gynecology

## 2021-05-17 VITALS — BP 124/88 | HR 89 | Ht 65.75 in | Wt 147.0 lb

## 2021-05-17 DIAGNOSIS — Z01419 Encounter for gynecological examination (general) (routine) without abnormal findings: Secondary | ICD-10-CM | POA: Insufficient documentation

## 2021-05-17 DIAGNOSIS — Z3009 Encounter for other general counseling and advice on contraception: Secondary | ICD-10-CM

## 2021-05-17 DIAGNOSIS — Z9889 Other specified postprocedural states: Secondary | ICD-10-CM

## 2021-05-17 NOTE — Progress Notes (Signed)
[place] 1985-11-05 [id number]   History:    36 y.o. G2P2L2 Divorced  RP:  Established patient presenting for annual gyn exam   HPI:  Menses regular normal every month.  Abstinent.  No pelvic pain.  Pap Neg 12/2019.  HPV HR Neg in 2018.  History of CIN 3 with LEEP in 2017.  Breasts normal.  Urine/BMs normal. BMI 23.91.  Physically active.  Health labs wnl in 12/2019.  Under investigation for enlarged LNs, a LN Bx is scheduled.  Past medical history,surgical history, family history and social history were all reviewed and documented in the EPIC chart.  Gynecologic History Patient's last menstrual period was 04/20/2021 (exact date).  Obstetric History OB History  Gravida Para Term Preterm AB Living  2 2 2     2   SAB IAB Ectopic Multiple Live Births          2    # Outcome Date GA Lbr Len/2nd Weight Sex Delivery Anes PTL Lv  2 Term 01/30/12 [redacted]w[redacted]d 03:31 / 01:29 6 lb 13.5 oz (3.105 kg) F Vag-Spont EPI  LIV  1 Term              ROS: A ROS was performed and pertinent positives and negatives are included in the history.  GENERAL: No fevers or chills. HEENT: No change in vision, no earache, sore throat or sinus congestion. NECK: No pain or stiffness. CARDIOVASCULAR: No chest pain or pressure. No palpitations. PULMONARY: No shortness of breath, cough or wheeze. GASTROINTESTINAL: No abdominal pain, nausea, vomiting or diarrhea, melena or bright red blood per rectum. GENITOURINARY: No urinary frequency, urgency, hesitancy or dysuria. MUSCULOSKELETAL: No joint or muscle pain, no back pain, no recent trauma. DERMATOLOGIC: No rash, no itching, no lesions. ENDOCRINE: No polyuria, polydipsia, no heat or cold intolerance. No recent change in weight. HEMATOLOGICAL: No anemia or easy bruising or bleeding. NEUROLOGIC: No headache, seizures, numbness, tingling or weakness. PSYCHIATRIC: No depression, no loss of interest in normal activity or change in sleep pattern.     Exam:   BP 124/88     Pulse 89    Ht 5' 5.75" (1.67 m)    Wt 147 lb (66.7 kg)    LMP 04/20/2021 (Exact Date)    SpO2 99%    BMI 23.91 kg/m   Body mass index is 23.91 kg/m.  General appearance : Well developed well nourished female. No acute distress HEENT: Eyes: no retinal hemorrhage or exudates,  Neck supple, trachea midline, no carotid bruits, no thyroidmegaly Lungs: Clear to auscultation, no rhonchi or wheezes, or rib retractions  Heart: Regular rate and rhythm, no murmurs or gallops Breast:Examined in sitting and supine position were symmetrical in appearance, no palpable masses or tenderness,  no skin retraction, no nipple inversion, no nipple discharge, no skin discoloration, no axillary or supraclavicular lymphadenopathy Abdomen: no palpable masses or tenderness, no rebound or guarding Extremities: no edema or skin discoloration or tenderness  Pelvic: Vulva: Normal             Vagina: No gross lesions or discharge  Cervix: No gross lesions or discharge  Uterus  AV, normal size, shape and consistency, non-tender and mobile  Adnexa  Without masses or tenderness  Anus: Normal   Assessment/Plan:  36 y.o. female for annual exam   1. Encounter for routine gynecological examination with Papanicolaou smear of cervix Menses regular normal every month.  Abstinent.  No pelvic pain.  Pap Neg 12/2019.  HPV HR Neg in  2018.  History of CIN 3 with LEEP in 2017.  Pap reflex done today.  Breasts normal.  Urine/BMs normal. BMI 23.91.  Physically active.  Health labs wnl in 12/2019.  Under investigation for enlarged LNs, a LN Bx is scheduled. - Cytology - PAP( [place])  2. H/O LEEP - Cytology - PAP( [place])  3. Encounter for other general counseling or advice on contraception  Currently abstinent.  Princess Bruins MD, 8:48 AM 05/17/2021

## 2021-05-18 LAB — CYTOLOGY - PAP: Diagnosis: NEGATIVE

## 2021-05-26 ENCOUNTER — Other Ambulatory Visit: Payer: Managed Care, Other (non HMO)

## 2021-05-26 ENCOUNTER — Ambulatory Visit: Payer: Managed Care, Other (non HMO) | Admitting: Hematology and Oncology

## 2021-05-28 ENCOUNTER — Other Ambulatory Visit: Payer: Self-pay | Admitting: Otolaryngology

## 2021-05-28 ENCOUNTER — Other Ambulatory Visit (HOSPITAL_COMMUNITY)
Admission: RE | Admit: 2021-05-28 | Discharge: 2021-05-28 | Disposition: A | Discharge status: Home / Self Care | Payer: Managed Care, Other (non HMO) | Source: Ambulatory Visit | Attending: Otolaryngology | Admitting: Otolaryngology

## 2021-05-31 ENCOUNTER — Other Ambulatory Visit: Payer: Self-pay | Admitting: Hematology and Oncology

## 2021-05-31 DIAGNOSIS — R590 Localized enlarged lymph nodes: Secondary | ICD-10-CM

## 2021-06-03 ENCOUNTER — Other Ambulatory Visit: Payer: Self-pay

## 2021-06-03 ENCOUNTER — Inpatient Hospital Stay: Payer: Managed Care, Other (non HMO) | Attending: Hematology and Oncology

## 2021-06-03 ENCOUNTER — Inpatient Hospital Stay: Payer: Managed Care, Other (non HMO) | Admitting: Hematology and Oncology

## 2021-06-03 VITALS — BP 106/71 | HR 98 | Temp 97.7°F | Resp 17 | Wt 134.0 lb

## 2021-06-03 DIAGNOSIS — Z79899 Other long term (current) drug therapy: Secondary | ICD-10-CM | POA: Diagnosis not present

## 2021-06-03 DIAGNOSIS — R590 Localized enlarged lymph nodes: Secondary | ICD-10-CM | POA: Insufficient documentation

## 2021-06-03 DIAGNOSIS — R21 Rash and other nonspecific skin eruption: Secondary | ICD-10-CM

## 2021-06-03 LAB — CBC WITH DIFFERENTIAL (CANCER CENTER ONLY)
Abs Immature Granulocytes: 0.03 10*3/uL (ref 0.00–0.07)
Basophils Absolute: 0 10*3/uL (ref 0.0–0.1)
Basophils Relative: 1 %
Eosinophils Absolute: 0 10*3/uL (ref 0.0–0.5)
Eosinophils Relative: 1 %
HCT: 36.8 % (ref 36.0–46.0)
Hemoglobin: 12.3 g/dL (ref 12.0–15.0)
Immature Granulocytes: 1 %
Lymphocytes Relative: 11 %
Lymphs Abs: 0.4 10*3/uL — ABNORMAL LOW (ref 0.7–4.0)
MCH: 28 pg (ref 26.0–34.0)
MCHC: 33.4 g/dL (ref 30.0–36.0)
MCV: 83.6 fL (ref 80.0–100.0)
Monocytes Absolute: 0.2 10*3/uL (ref 0.1–1.0)
Monocytes Relative: 5 %
Neutro Abs: 3.1 10*3/uL (ref 1.7–7.7)
Neutrophils Relative %: 81 %
Platelet Count: 242 10*3/uL (ref 150–400)
RBC: 4.4 MIL/uL (ref 3.87–5.11)
RDW: 13.2 % (ref 11.5–15.5)
Smear Review: NORMAL
WBC Count: 3.7 10*3/uL — ABNORMAL LOW (ref 4.0–10.5)
nRBC: 0 % (ref 0.0–0.2)

## 2021-06-03 LAB — CMP (CANCER CENTER ONLY)
ALT: 27 U/L (ref 0–44)
AST: 34 U/L (ref 15–41)
Albumin: 3.6 g/dL (ref 3.5–5.0)
Alkaline Phosphatase: 64 U/L (ref 38–126)
Anion gap: 8 (ref 5–15)
BUN: 15 mg/dL (ref 6–20)
CO2: 24 mmol/L (ref 22–32)
Calcium: 8.8 mg/dL — ABNORMAL LOW (ref 8.9–10.3)
Chloride: 106 mmol/L (ref 98–111)
Creatinine: 0.48 mg/dL (ref 0.44–1.00)
GFR, Estimated: >60 mL/min (ref >60)
Glucose, Bld: 82 mg/dL (ref 70–99)
Potassium: 4.1 mmol/L (ref 3.5–5.1)
Sodium: 138 mmol/L (ref 135–145)
Total Bilirubin: 0.3 mg/dL (ref 0.3–1.2)
Total Protein: 8 g/dL (ref 6.5–8.1)

## 2021-06-03 LAB — LACTATE DEHYDROGENASE: LDH: 337 U/L — ABNORMAL HIGH (ref 98–192)

## 2021-06-03 LAB — C-REACTIVE PROTEIN: CRP: 0.8 mg/dL (ref <1.0)

## 2021-06-03 LAB — SEDIMENTATION RATE: Sed Rate: 70 mm/hr — ABNORMAL HIGH (ref 0–22)

## 2021-06-03 LAB — SURGICAL PATHOLOGY

## 2021-06-03 NOTE — Progress Notes (Signed)
[facility] [phone]   [phone]  PROGRESS NOTE  Patient Care Team: [name], Walter, MD as PCP - General (Internal Medicine)  Hematological/Oncological History # Bilateral Axillary Lymphadenopathy 01/08/2021: patient underwent CT chest w contrast which showed bilateral moderate axillary and subpectoral adenopathy 02/03/2021: establish care with Dr. Leonides Schanzorsey  03/11/2021: ENT performed an FNA lymph node of left supraclavicular node. Results consistent with heterogenous lymphoid proliferation, favor reactive.  05/28/2021: Excisional lymph node biopsy performed, results pending.  Interval History:  Laura Osborne 36 y.o. female with medical history significant for bilateral axillary lymphadenopathy who presents for a follow up visit. The patient's last visit was on 02/03/2021. In the interim since the last visit she underwent a biopsy of 03/11/2021 with no evidence of a hematological malignancy.   On exam today Laura Osborne reports that she underwent an excisional lymph node biopsy on 05/28/2021.  She notes that she is having some mild pain and tenderness at the excisional site.  It is otherwise well-healing.  She notes that she does continue to have fevers and did have a fever of 102 on the day of surgery.  She reports that she had some low-grade fevers after that time.  She continues to have headaches and chills but denies any nausea,, or diarrhea.  She has developed a new rash on her cheeks and across her nose consistent with a malar rash.  She unfortunately is also having weight loss at this time.  Full 10 point this is listed below.  MEDICAL HISTORY:  Past Medical History:  Diagnosis Date   Anemia    Bilateral carpal tunnel syndrome 08/14/2017   No pertinent past medical history    Vitamin D deficiency     SURGICAL HISTORY: Past Surgical History:  Procedure Laterality Date   CERVICAL BIOPSY  W/ LOOP ELECTRODE EXCISION  2017   CIN 3 Clear margins    WISDOM TOOTH EXTRACTION      SOCIAL HISTORY: Social History   Socioeconomic History   Marital status: Single    Spouse name: Not on file   Number of children: 2   Years of education: 14   Highest education level: Not on file  Occupational History   Occupation: [employer]  Tobacco Use   Smoking status: Never   Smokeless tobacco: Never  Vaping Use   Vaping Use: Never used  Substance and Sexual Activity   Alcohol use: Yes    Alcohol/week: 0.0 standard drinks    Comment: Rare   Drug use: No   Sexual activity: Not Currently    Birth control/protection: None    Comment: 1st intercourse- 23, Fewer than 5 partners   Other Topics Concern   Not on file  Social History Narrative   Lives alone w/ children   Caffeine use: Coffee 2-3 times per week   Right handed    Social Determinants of Health   Financial Resource Strain: Not on file  Food Insecurity: Not on file  Transportation Needs: Not on file  Physical Activity: Not on file  Stress: Not on file  Social Connections: Not on file  Intimate Partner Violence: Not on file    FAMILY HISTORY: Family History  Problem Relation Age of Onset   Heart attack Father    Hypertension Maternal Grandmother    Hypertension Maternal Grandfather    Diabetes Maternal Grandfather    Heart disease Maternal Grandfather    Cancer Paternal Grandfather        Colon  ALLERGIES:  has No Known Allergies.  MEDICATIONS:  Current Outpatient Medications  Medication Sig Dispense Refill   cetirizine (ZYRTEC) 10 MG tablet Take 10 mg by mouth daily.     cholecalciferol (VITAMIN D3) 25 MCG (1000 UNIT) tablet Take 1,000 Units by mouth daily.     famotidine (PEPCID) 10 MG tablet Take 10 mg by mouth as needed for heartburn or indigestion.     fluticasone (FLONASE) 50 MCG/ACT nasal spray Place 1-2 sprays into both nostrils daily as needed (for seasonal allergies or rhinitis).     hydrOXYzine (ATARAX/VISTARIL) 50 MG tablet Take 100 mg by mouth 2  (two) times daily as needed for anxiety.     ibuprofen (ADVIL) 200 MG tablet Take 800 mg by mouth every 6 (six) hours as needed (for pain/discomfort, fevers, and/or headaches).     naproxen (NAPROSYN) 500 MG tablet Take 1 tablet (500 mg total) by mouth 2 (two) times daily. 30 tablet 0   Prenatal Vit-DSS-Fe Fum-FA (PRENATAL 19) 29-1 MG TABS Take 1 tablet by mouth daily with breakfast.     No current facility-administered medications for this visit.    REVIEW OF SYSTEMS:   Constitutional: ( - ) fevers, ( - )  chills , ( - ) night sweats Eyes: ( - ) blurriness of vision, ( - ) double vision, ( - ) watery eyes Ears, nose, mouth, throat, and face: ( - ) mucositis, ( - ) sore throat Respiratory: ( - ) cough, ( - ) dyspnea, ( - ) wheezes Cardiovascular: ( - ) palpitation, ( - ) chest discomfort, ( - ) lower extremity swelling Gastrointestinal:  ( - ) nausea, ( - ) heartburn, ( - ) change in bowel habits Skin: ( - ) abnormal skin rashes Lymphatics: ( - ) new lymphadenopathy, ( - ) easy bruising Neurological: ( - ) numbness, ( - ) tingling, ( - ) new weaknesses Behavioral/Psych: ( - ) mood change, ( - ) new changes  All other systems were reviewed with the patient and are negative.  PHYSICAL EXAMINATION:  Vitals:   06/03/21 0829  BP: 106/71  Pulse: 98  Resp: 17  Temp: 97.7 F (36.5 C)  SpO2: 100%   Filed Weights   06/03/21 0829  Weight: 134 lb (60.8 kg)    GENERAL: Well-appearing middle-aged African-American female, alert, no distress and comfortable SKIN: skin color, texture, turgor are normal, no rashes or significant lesions EYES: conjunctiva are pink and non-injected, sclera clear NECK: supple, non-tender.  Well-healing surgical site above left clavicular lymph node. LYMPH:  no palpable lymphadenopathy in the cervical, axillary or inguinal LUNGS: clear to auscultation and percussion with normal breathing effort HEART: regular rate & rhythm and no murmurs and no lower extremity  edema Musculoskeletal: no cyanosis of digits and no clubbing  PSYCH: alert & oriented x 3, fluent speech NEURO: no focal motor/sensory deficits  LABORATORY DATA:  I have reviewed the data as listed CBC Latest Ref Rng & Units 06/03/2021 02/10/2021 02/03/2021  WBC 4.0 - 10.5 K/uL 3.7(L) 3.1(L) 2.9(L)  Hemoglobin 12.0 - 15.0 g/dL 93.8 11.6(L) 10.8(L)  Hematocrit 36.0 - 46.0 % 36.8 35.0(L) 32.6(L)  Platelets 150 - 400 K/uL 242 175 251    CMP Latest Ref Rng & Units 06/03/2021 02/10/2021 02/03/2021  Glucose 70 - 99 mg/dL 82 93 83  BUN 6 - 20 mg/dL 15 15 9   Creatinine 0.44 - 1.00 mg/dL 1.82 9.93  Sodium 135 - 145 mmol/L 138 132(L) 138  Potassium 3.5 -  5.1 mmol/L 4.1 3.7 3.7  Chloride 98 - 111 mmol/L 106 101 104  CO2 22 - 32 mmol/L 24 22 25   Calcium 8.9 - 10.3 mg/dL ) 8.9 9.2  Total Protein 6.5 - 8.1 g/dL 8.0 1.0(C) 8.3(H)  Total Bilirubin 0.3 - 1.2 mg/dL 0.3 0.6 0.3  Alkaline Phos 38 - 126 U/L 64 49 64  AST 15 - 41 U/L 34 68(H) 36  ALT 0 - 44 U/L 27 38 27   RADIOGRAPHIC STUDIES: No results found.  ASSESSMENT & PLAN Laura Osborne 36 y.o. female with medical history significant for bilateral axillary lymphadenopathy who presents for a follow up visit.   # Bilateral Axillary Lymphadenopathy -- Patient notes that these flare anticipated, these are currently not flared --Patient is status post excisional biopsy on 05/28/2021.  Site is healing well --Prior FNA performed by ENT shows no evidence of hematological malignancy --Excisional biopsy pathology of reports currently pending.  --Return to clinic pending results of biopsy.  # Malar Rash -- Classical malar rash presenting across the patient's cheeks and nose --This in conjunction with her lymphadenopathy with no clear evidence of malignancy and current fevers is consistent with rheumatological disease --We will make ambulatory referral to rheumatology  Orders Placed This Encounter  Procedures   Ambulatory referral to  Rheumatology    Referral Priority:   Routine    Referral Type:   Consultation    Referral Reason:   Specialty Services Required    Requested Specialty:   Rheumatology    Number of Visits Requested:   1    All questions were answered. The patient knows to call the clinic with any problems, questions or concerns.  A total of more than 30 minutes were spent on this encounter with face-to-face time and non-face-to-face time, including preparing to see the patient, ordering tests and/or medications, counseling the patient and coordination of care as outlined above.   07/28/2021, MD Department of Hematology/Oncology [facility] Phone: [phone] Pager: [phone] Email: [phone]  06/03/2021 9:27 AM

## 2021-06-04 ENCOUNTER — Telehealth: Payer: Self-pay | Admitting: *Deleted

## 2021-06-04 NOTE — Telephone Encounter (Signed)
Received vm message from pt inquiring about biopsy results from excisional lymph node biopsy. ?TCT patient. No answer. LVM message for pt to return call to [phone] ?

## 2021-06-04 NOTE — Telephone Encounter (Signed)
Received call back from pt. Reviewed biopsy results with her. Advised that the biopsy revealed inflammation, no cancerous or lymphoma, malignant cells were noted. Advised that we have made a referral to [facility] so she can be evaluated for autoimmune illness such as Lupus. Pt voiced understanding. ? ? ?Referral fax'd to Dr. Fatima Sanger office @ [facility] ?

## 2021-06-09 ENCOUNTER — Telehealth: Payer: Self-pay

## 2021-06-09 NOTE — Telephone Encounter (Signed)
Spoke with patient to advise that referral was sent ,at her request, for Rheumatology appointment at [facility] ?

## 2021-06-22 DIAGNOSIS — M329 Systemic lupus erythematosus, unspecified: Secondary | ICD-10-CM | POA: Insufficient documentation

## 2021-10-06 ENCOUNTER — Ambulatory Visit: Payer: Managed Care, Other (non HMO) | Admitting: Rheumatology

## 2021-10-27 ENCOUNTER — Ambulatory Visit: Payer: Managed Care, Other (non HMO) | Admitting: Rheumatology

## 2021-10-28 IMAGING — CR DG CHEST 2V
2 series · 2 of 2 positions shown · non-contrast
Comparison: 09/27/2020

CLINICAL DATA: Cough, congestion, fever, nausea vomiting,
intermittent for 21 days, completed 2 rounds of antibiotics.

EXAM:
CHEST - 2 VIEW

[w chest pa]
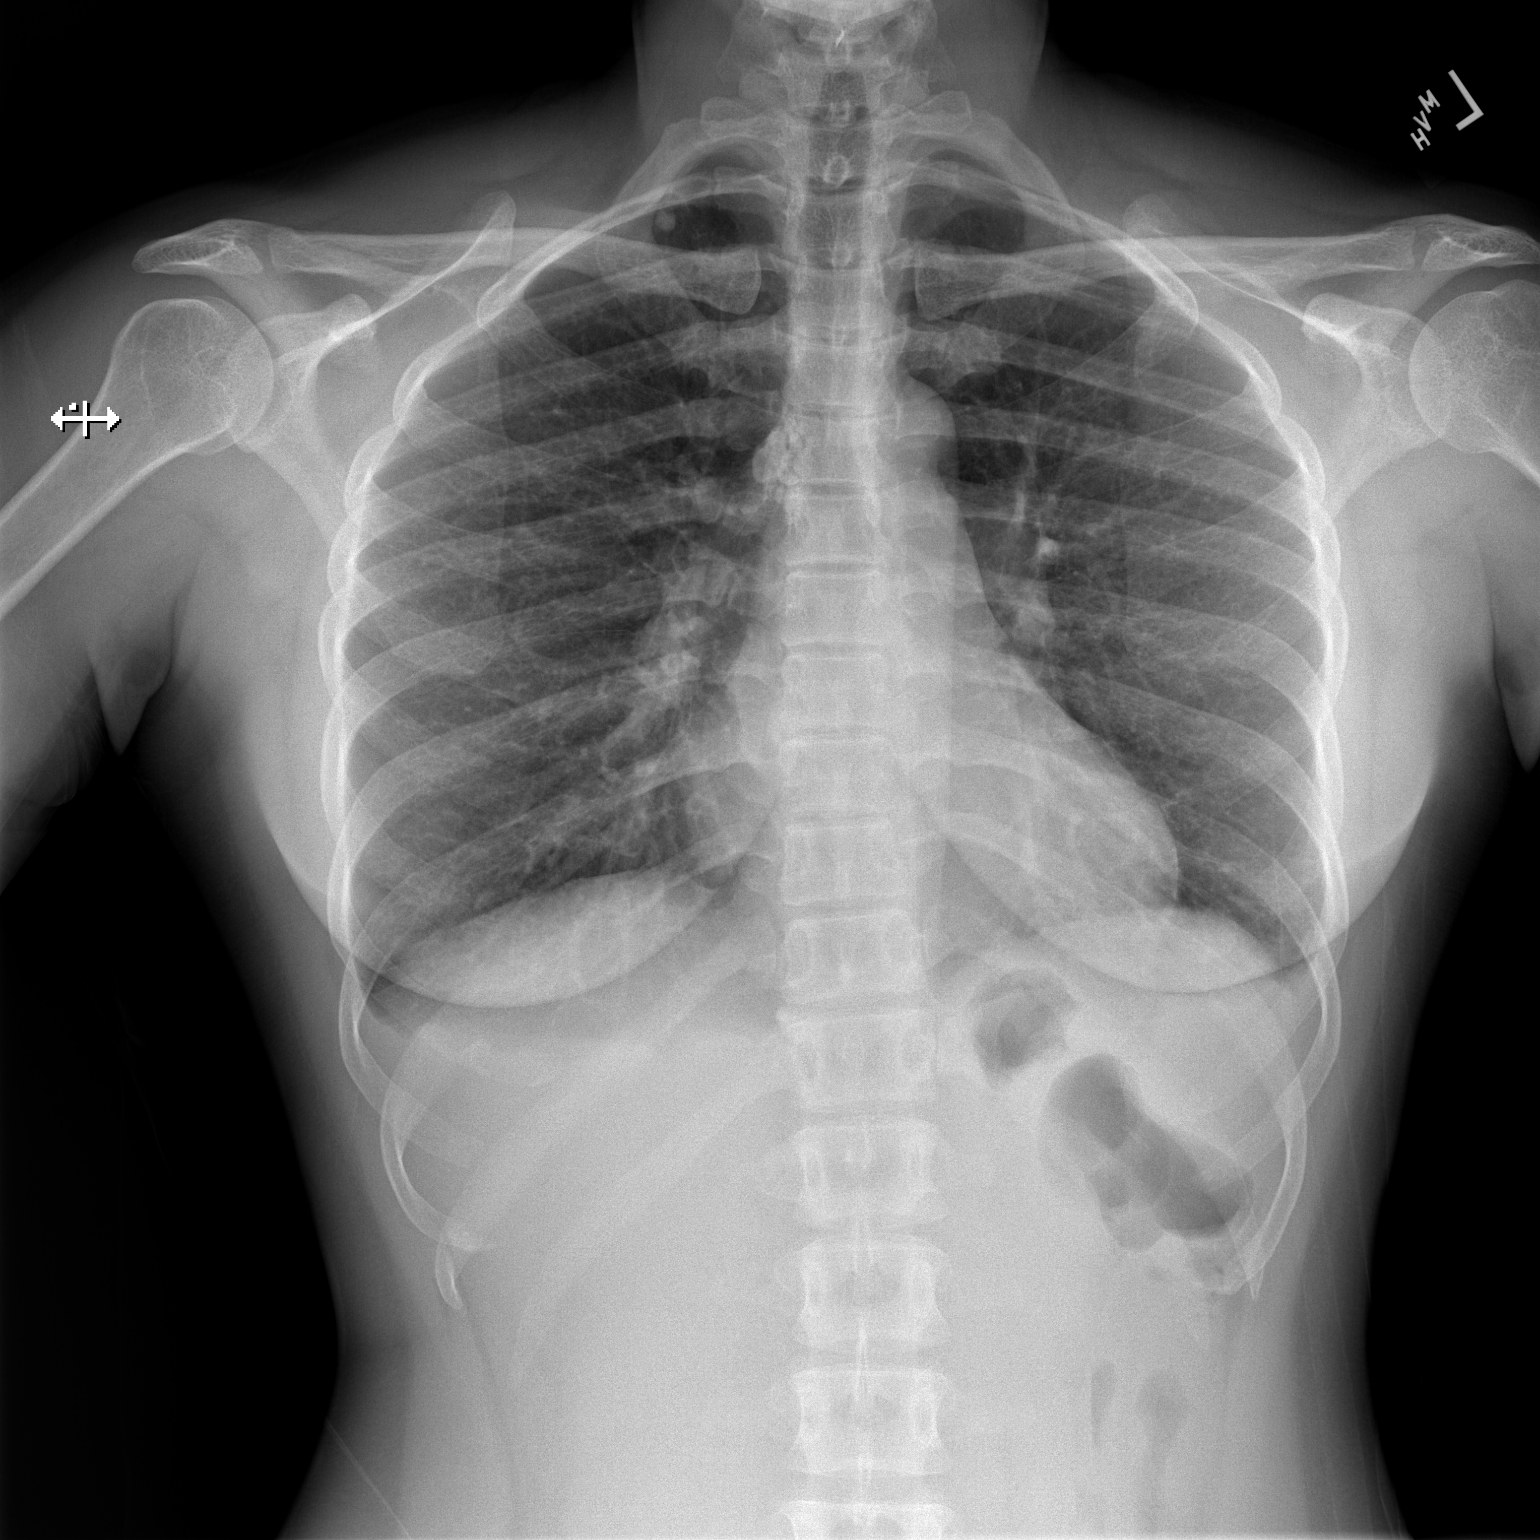

[w chest lat]
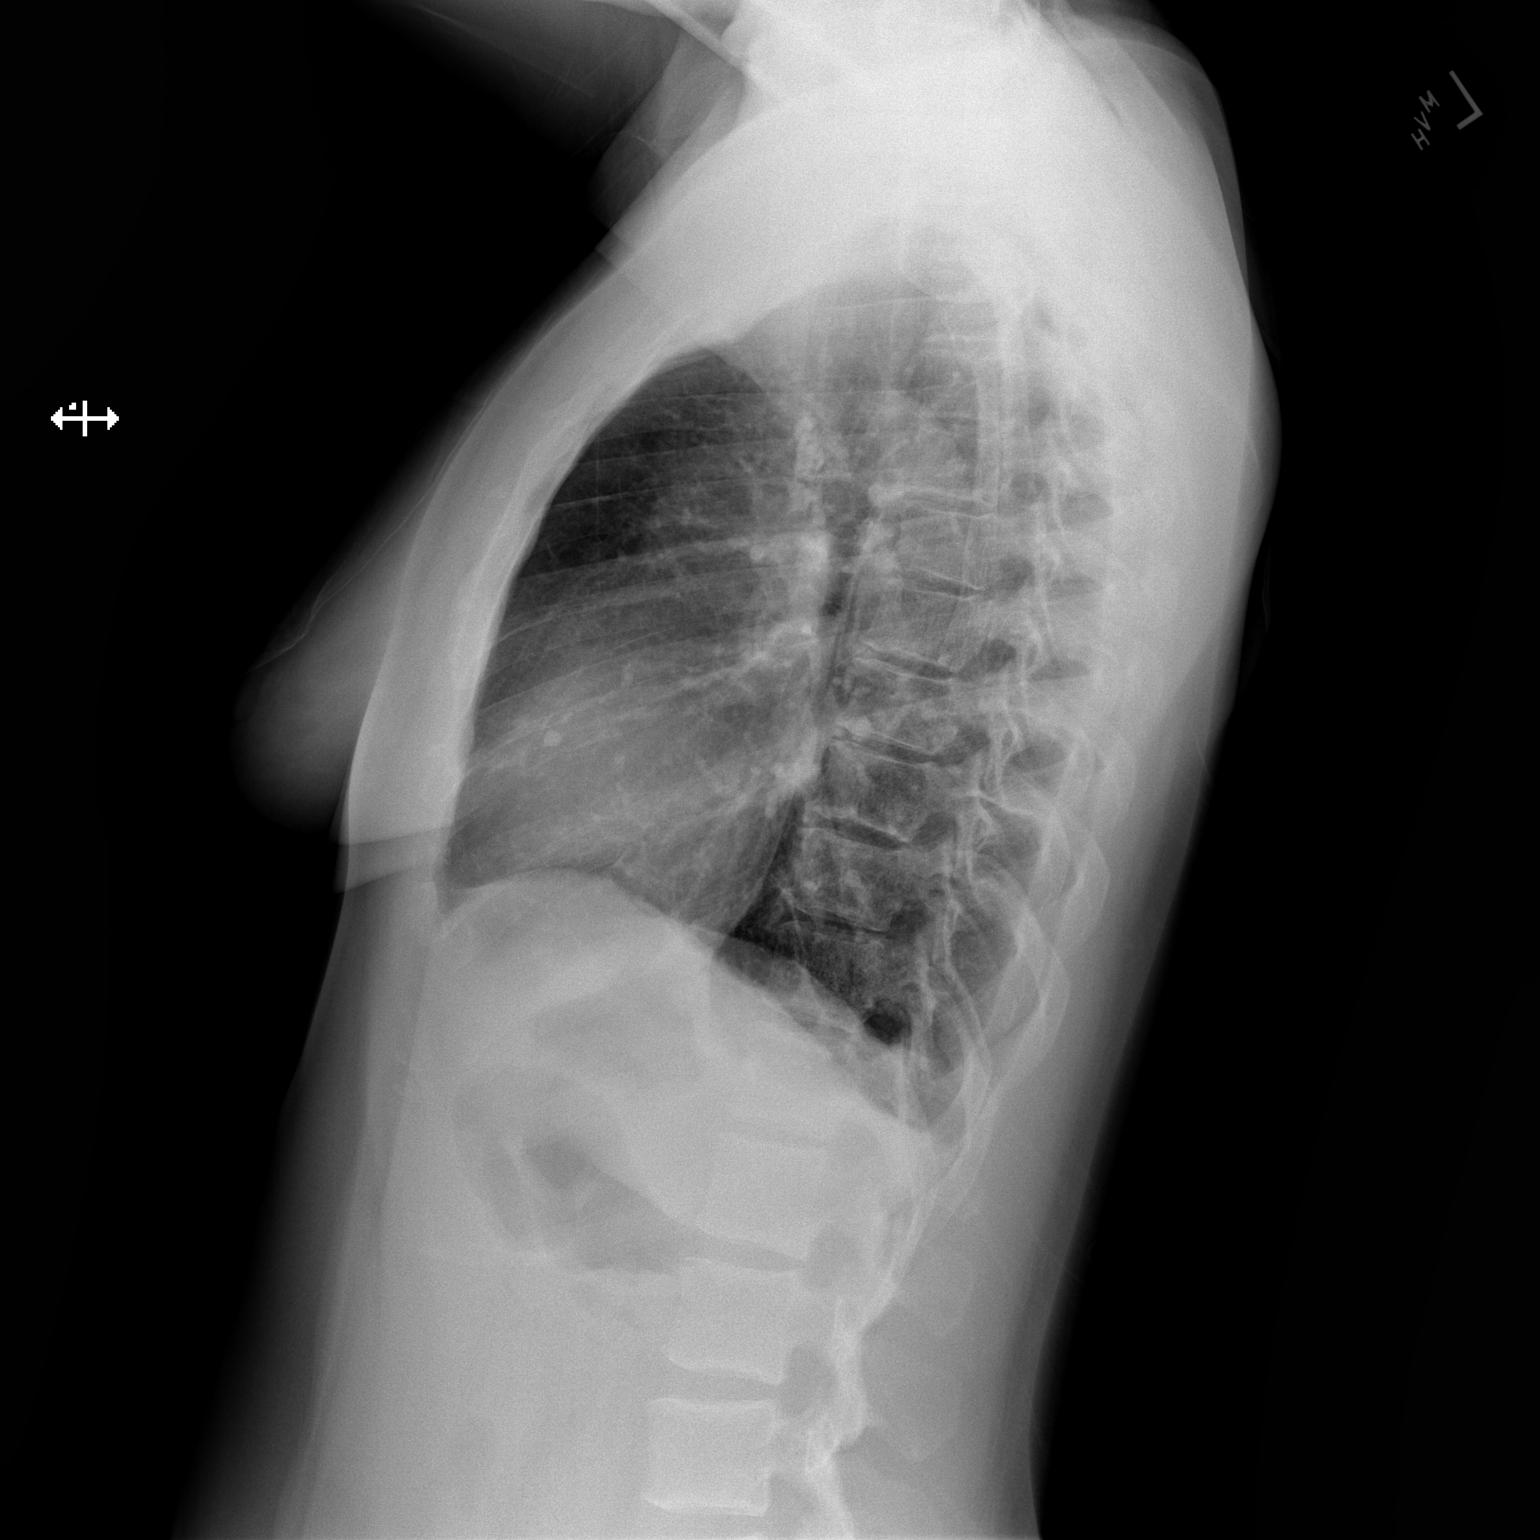

[2 of 2 positions shown; findings below may reference images not displayed]

FINDINGS: No consolidation, features of edema, pneumothorax, or effusion.
Pulmonary vascularity is normally distributed. The cardiomediastinal
contours are unremarkable. No acute osseous or soft tissue
abnormality.
IMPRESSION: No acute cardiopulmonary abnormality.

## 2022-05-19 ENCOUNTER — Other Ambulatory Visit (HOSPITAL_COMMUNITY)
Admission: RE | Admit: 2022-05-19 | Discharge: 2022-05-19 | Disposition: A | Discharge status: Home / Self Care | Payer: Managed Care, Other (non HMO) | Source: Ambulatory Visit | Attending: Obstetrics & Gynecology | Admitting: Obstetrics & Gynecology

## 2022-05-19 ENCOUNTER — Encounter: Payer: Self-pay | Admitting: Obstetrics & Gynecology

## 2022-05-19 ENCOUNTER — Ambulatory Visit (INDEPENDENT_AMBULATORY_CARE_PROVIDER_SITE_OTHER): Payer: Managed Care, Other (non HMO) | Admitting: Obstetrics & Gynecology

## 2022-05-19 VITALS — BP 118/80 | HR 80 | Ht 64.25 in | Wt 175.0 lb

## 2022-05-19 DIAGNOSIS — Z9889 Other specified postprocedural states: Secondary | ICD-10-CM | POA: Insufficient documentation

## 2022-05-19 DIAGNOSIS — Z01419 Encounter for gynecological examination (general) (routine) without abnormal findings: Secondary | ICD-10-CM

## 2022-05-19 DIAGNOSIS — M329 Systemic lupus erythematosus, unspecified: Secondary | ICD-10-CM

## 2022-05-19 DIAGNOSIS — Z3009 Encounter for other general counseling and advice on contraception: Secondary | ICD-10-CM

## 2022-05-19 NOTE — Progress Notes (Signed)
Laura Osborne 31-Jan-1986 [id number]   History:    37 y.o.  G2P2L2 Divorced.  Daughter 56 yo and Son 67 yo.   RP:  Established patient presenting for annual gyn exam    HPI:  Menses regular normal every month.  Abstinent.  No pelvic pain. Pap Neg 04/2021.  HPV HR Neg in 2018.  History of CIN 3 with LEEP in 2017.  Pap Reflex today.  Breasts normal.  Urine/BMs normal. BMI increased to 29.81.  Dxed with SLE on Prednisone, Methotraxate, Plaquenil and Belimumab IV. Will adjust diet to lower calories. Physically active.  Health labs with Fam MD. Flu vaccine at pharmacy.  Past medical history,surgical history, family history and social history were all reviewed and documented in the EPIC chart.  Gynecologic History Patient's last menstrual period was 04/26/2022.  Obstetric History OB History  Gravida Para Term Preterm AB Living  2 2 2     2  $ SAB IAB Ectopic Multiple Live Births          2    # Outcome Date GA Lbr Len/2nd Weight Sex Delivery Anes PTL Lv  2 Term 01/30/12 [id number]:31 / 01:29 6 lb 13.5 oz (3.105 kg) F Vag-Spont EPI  LIV  1 Term              ROS: A ROS was performed and pertinent positives and negatives are included in the history. GENERAL: No fevers or chills. HEENT: No change in vision, no earache, sore throat or sinus congestion. NECK: No pain or stiffness. CARDIOVASCULAR: No chest pain or pressure. No palpitations. PULMONARY: No shortness of breath, cough or wheeze. GASTROINTESTINAL: No abdominal pain, nausea, vomiting or diarrhea, melena or bright red blood per rectum. GENITOURINARY: No urinary frequency, urgency, hesitancy or dysuria. MUSCULOSKELETAL: No joint or muscle pain, no back pain, no recent trauma. DERMATOLOGIC: No rash, no itching, no lesions. ENDOCRINE: No polyuria, polydipsia, no heat or cold intolerance. No recent change in weight. HEMATOLOGICAL: No anemia or easy bruising or bleeding. NEUROLOGIC: No headache, seizures, numbness, tingling or weakness.  PSYCHIATRIC: No depression, no loss of interest in normal activity or change in sleep pattern.     Exam:   BP 118/80   Pulse 80   Ht 5' 4.25" (1.632 m)   Wt 175 lb (79.4 kg)   LMP 04/26/2022 Comment: not sexually active  SpO2 99%   BMI 29.81 kg/m   Body mass index is 29.81 kg/m.  General appearance : Well developed well nourished female. No acute distress HEENT: Eyes: no retinal hemorrhage or exudates,  Neck supple, trachea midline, no carotid bruits, no thyroidmegaly Lungs: Clear to auscultation, no rhonchi or wheezes, or rib retractions  Heart: Regular rate and rhythm, no murmurs or gallops Breast:Examined in sitting and supine position were symmetrical in appearance, no palpable masses or tenderness,  no skin retraction, no nipple inversion, no nipple discharge, no skin discoloration, no axillary or supraclavicular lymphadenopathy Abdomen: no palpable masses or tenderness, no rebound or guarding Extremities: no edema or skin discoloration or tenderness  Pelvic: Vulva: Normal             Vagina: No gross lesions or discharge  Cervix: No gross lesions or discharge  Uterus  AV, normal size, shape and consistency, non-tender and mobile  Adnexa  Without masses or tenderness  Anus: Normal   Assessment/Plan:  37y.o. female for annual exam   1. Encounter for routine gynecological examination with Papanicolaou smear of cervix Menses regular normal every  month.  Abstinent.  No pelvic pain. Pap Neg 04/2021.  HPV HR Neg in 2018.  History of CIN 3 with LEEP in 2017.  Pap Reflex today.  Breasts normal.  Urine/BMs normal. BMI increased to 29.81.  Dxed with SLE on Prednisone, Methotraxate, Plaquenil and Belimumab IV.  Will adjust diet to lower calories. Physically active.  Health labs with Fam MD. Flu vaccine at pharmacy. - Cytology - PAP( [place])  2. H/O LEEP Severe dysplasia (CIN 3) LEEP margins Neg in 2017. Paps Neg since then.   HPV HR Neg in 2018.  Pap reflex today. - Cytology  - PAP( )  3. Encounter for other general counseling or advice on contraception Currently abstinent.  ParaGuard IUD suggested if needs contraception in the context of SLE.  4. Systemic lupus erythematosus, unspecified SLE type, unspecified organ involvement status ([place]) SLE Dxed a year ago.  On Prednisone, Methotraxate, Plaquenil and Belimumab IV.  Other orders - folic acid (FOLVITE) 1 MG tablet; Take 1 mg by mouth daily. - hydroxychloroquine (PLAQUENIL) 200 MG tablet; Take 300 mg by mouth daily. - methotrexate (RHEUMATREX) 2.5 MG tablet; Take 20 mg by mouth once a week. - predniSONE (DELTASONE) 5 MG tablet; Take 10 mg by mouth daily. - sertraline (ZOLOFT) 50 MG tablet; Take 25 mg by mouth daily. - Belimumab (BENLYSTA IV); Inject into the vein. infusions   Princess Bruins MD, 8:52 AM

## 2022-05-24 LAB — CYTOLOGY - PAP
Comment: NEGATIVE
Diagnosis: UNDETERMINED — AB
High risk HPV: POSITIVE — AB

## 2022-05-26 ENCOUNTER — Other Ambulatory Visit: Payer: Self-pay

## 2022-05-26 DIAGNOSIS — R87610 Atypical squamous cells of undetermined significance on cytologic smear of cervix (ASC-US): Secondary | ICD-10-CM

## 2022-07-20 ENCOUNTER — Ambulatory Visit: Payer: Managed Care, Other (non HMO) | Admitting: Obstetrics & Gynecology

## 2022-07-20 ENCOUNTER — Other Ambulatory Visit (HOSPITAL_COMMUNITY)
Admission: RE | Admit: 2022-07-20 | Discharge: 2022-07-20 | Disposition: A | Discharge status: Home / Self Care | Payer: Managed Care, Other (non HMO) | Source: Ambulatory Visit | Attending: Obstetrics & Gynecology | Admitting: Obstetrics & Gynecology

## 2022-07-20 ENCOUNTER — Encounter: Payer: Self-pay | Admitting: Obstetrics & Gynecology

## 2022-07-20 VITALS — BP 118/76 | HR 91

## 2022-07-20 DIAGNOSIS — Z01812 Encounter for preprocedural laboratory examination: Secondary | ICD-10-CM

## 2022-07-20 DIAGNOSIS — R87810 Cervical high risk human papillomavirus (HPV) DNA test positive: Secondary | ICD-10-CM

## 2022-07-20 DIAGNOSIS — R87610 Atypical squamous cells of undetermined significance on cytologic smear of cervix (ASC-US): Secondary | ICD-10-CM | POA: Insufficient documentation

## 2022-07-20 DIAGNOSIS — B372 Candidiasis of skin and nail: Secondary | ICD-10-CM | POA: Diagnosis not present

## 2022-07-20 LAB — PREGNANCY, URINE: Preg Test, Ur: NEGATIVE

## 2022-07-20 MED ORDER — NYSTATIN 100000 UNIT/GM EX POWD: 1.0000 | Freq: Two times a day (BID) | CUTANEOUS | 2 refills | Status: AC

## 2022-07-20 NOTE — Progress Notes (Signed)
    Laura Osborne 1986-01-02 [id number]        37 y.o.  G2P2L2 Divorced  RP: PAP: 05-19-22 ascus hpv hr+ for Colpo  HPI: Pap ASCUS/HPV HR Pos 05/19/22.   Pap Neg 04/2021.  HPV HR Neg in 2018.  History of CIN 3 with LEEP in 2017.  Abstinent currently.  C/O discomfort, inflammation at the left vulva/inguinal area.   OB History  Gravida Para Term Preterm AB Living  SAB IAB Ectopic Multiple Live Births          2    # Outcome Date GA Lbr Len/2nd Weight Sex Delivery Anes PTL Lv  2 Term 01/30/12 [redacted]w[redacted]d 03:31 / 01:29 6 lb 13.5 oz (3.105 kg) F Vag-Spont EPI  LIV  1 Term             Past medical history,surgical history, problem list, medications, allergies, family history and social history were all reviewed and documented in the EPIC chart.   Directed ROS with pertinent positives and negatives documented in the history of present illness/assessment and plan.  Exam:  Vitals:   07/20/22 1502  BP: 118/76  Pulse: 91  SpO2: 97%   General appearance:  Normal  Colposcopy Procedure Note DIMOND CROTTY 07/20/2022  Indications: ASCUS/HPV HR Pos  Procedure Details  The risks and benefits of the procedure and Written informed consent obtained.  Speculum placed in vagina and excellent visualization of cervix achieved, cervix swabbed x 3 with acetic acid solution.  Findings:  Cervix colposcopy: Physical Exam Genitourinary:        Vaginal colposcopy: Normal  Vulvar colposcopy: Normal  Perirectal colposcopy: Normal  The cervix was sprayed with Hurricane before performing the cervical biopsies.  Specimens: HPV 16-18-45.  Cervical Bxs at 7, 10, 2 O'Clock.  Complications: No Cx, Silver Nitrate/Moncel for hemostasis. . Plan:  Management per results.   Assessment/Plan:  37 y.o. G2P2002   1. 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   Pap Neg 04/2021.  HPV HR Neg in 2018.  History of CIN 3 with LEEP in 2017.  Abstinent currently.   C/O discomfort, inflammation at the left vulva/inguinal area.  Colposcopy findings reviewed.  Management per results.  Post procedure precautions. - Colposcopy - Cervicovaginal ancillary only( [place]) - Surgical pathology( [place]/ [facility])  2. Encounter for preprocedural laboratory examination UPT Neg - Pregnancy, urine  3. Yeast infection of the skin Yeast of left vulva/inguinal area.  Nystatin powder prescription sent to pharmacy.  Usage reviewed.  Other orders - nystatin (MYCOSTATIN/NYSTOP) powder; Apply 1 Application topically 2 (two) times daily.   Genia Del MD, 3:09 PM 07/20/2022

## 2022-07-22 LAB — SURGICAL PATHOLOGY

## 2022-07-28 LAB — CERVICOVAGINAL ANCILLARY ONLY
Comment: NEGATIVE
Comment: NEGATIVE
HPV 16: NEGATIVE
HPV 18 / 45: NEGATIVE

## 2024-01-29 ENCOUNTER — Ambulatory Visit: Admission: EM | Admit: 2024-01-29 | Discharge: 2024-01-29 | Disposition: A | Discharge status: Home / Self Care

## 2024-01-29 ENCOUNTER — Encounter: Payer: Self-pay | Admitting: Emergency Medicine

## 2024-01-29 DIAGNOSIS — F410 Panic disorder [episodic paroxysmal anxiety] without agoraphobia: Secondary | ICD-10-CM | POA: Insufficient documentation

## 2024-01-29 DIAGNOSIS — E038 Other specified hypothyroidism: Secondary | ICD-10-CM | POA: Insufficient documentation

## 2024-01-29 DIAGNOSIS — S46811A Strain of other muscles, fascia and tendons at shoulder and upper arm level, right arm, initial encounter: Secondary | ICD-10-CM | POA: Diagnosis not present

## 2024-01-29 DIAGNOSIS — E559 Vitamin D deficiency, unspecified: Secondary | ICD-10-CM | POA: Insufficient documentation

## 2024-01-29 DIAGNOSIS — D509 Iron deficiency anemia, unspecified: Secondary | ICD-10-CM | POA: Insufficient documentation

## 2024-01-29 MED ORDER — BACLOFEN 10 MG PO TABS: 10.0000 mg | ORAL_TABLET | Freq: Three times a day (TID) | ORAL | 0 refills | Status: AC

## 2024-01-29 MED ORDER — DICLOFENAC SODIUM 50 MG PO TBEC: 50.0000 mg | DELAYED_RELEASE_TABLET | Freq: Two times a day (BID) | ORAL | 1 refills | Status: AC

## 2024-01-29 NOTE — Discharge Instructions (Signed)
  1. Strain of right subscapularis muscle, initial encounter (Primary) - diclofenac (VOLTAREN) 50 MG EC tablet; Take 1 tablet (50 mg total) by mouth 2 (two) times daily.  Dispense: 30 tablet; Refill: 1 - baclofen (LIORESAL) 10 MG tablet; Take 1 tablet (10 mg total) by mouth 3 (three) times daily.  Dispense: 30 each; Refill: 0 - Apply heat to the area 2-3 times a day to help with inflammation and muscle spasms. - No heavy lifting or strenuous physical activity until symptoms improved. -Continue to monitor symptoms for any change in severity if there is any escalation of current symptoms or development of new symptoms follow-up in ER for further evaluation and management.

## 2024-01-29 NOTE — ED Provider Notes (Signed)
 [facility]  Note:  This document was prepared using Conservation officer, historic buildings and may include unintentional dictation errors.  MRN: [id number] DOB: 04-04-1985  Subjective:   Laura Osborne is a 38 y.o. female presenting for evaluation of right thoracic/subscapular back pain x 3 days.  Patient denies any known injury or trauma but states that prior to the onset of symptoms she was cleaning the house and pain started shortly after without any improvement.  Patient reports it feels like a muscle spasm or a pulling sensation in her right mid/upper back.  Patient has been taking some ibuprofen  with minimal improvement to her symptoms.  Patient states that it is becoming more and more difficult to sleep due to the muscle spasm and pain in her back.  No current facility-administered medications for this encounter.  Current Outpatient Medications:    baclofen (LIORESAL) 10 MG tablet, Take 1 tablet (10 mg total) by mouth 3 (three) times daily., Disp: 30 each, Rfl: 0   Belimumab (BENLYSTA IV), Inject into the vein. infusions, Disp: , Rfl:    cetirizine (ZYRTEC) 10 MG tablet, Take 10 mg by mouth daily., Disp: , Rfl:    cholecalciferol (VITAMIN D3) 25 MCG (1000 UNIT) tablet, Take 1,000 Units by mouth daily., Disp: , Rfl:    diclofenac (VOLTAREN) 50 MG EC tablet, Take 1 tablet (50 mg total) by mouth 2 (two) times daily., Disp: 30 tablet, Rfl: 1   folic acid (FOLVITE) 1 MG tablet, Take 1 mg by mouth daily., Disp: , Rfl:    hydroxychloroquine (PLAQUENIL) 200 MG tablet, Take 300 mg by mouth daily., Disp: , Rfl:    ibuprofen  (ADVIL ) 200 MG tablet, Take 800 mg by mouth every 6 (six) hours as needed (for pain/discomfort, fevers, and/or headaches)., Disp: , Rfl:    methotrexate (RHEUMATREX) 2.5 MG tablet, Take 20 mg by mouth once a week., Disp: , Rfl:    naproxen  (NAPROSYN ) 500 MG tablet, Take 1 tablet (500 mg total) by mouth 2 (two) times daily. (Patient taking differently: Take 500 mg by  mouth as needed.), Disp: 30 tablet, Rfl: 0   predniSONE  (DELTASONE ) 5 MG tablet, Take 10 mg by mouth daily., Disp: , Rfl:    Prenatal Vit-DSS-Fe Fum-FA (PRENATAL 19) 29-1 MG TABS, Take 1 tablet by mouth daily with breakfast., Disp: , Rfl:    famotidine (PEPCID) 10 MG tablet, Take 10 mg by mouth as needed for heartburn or indigestion., Disp: , Rfl:    fluticasone (FLONASE) 50 MCG/ACT nasal spray, Place 1-2 sprays into both nostrils daily as needed (for seasonal allergies or rhinitis). (Patient not taking: Reported on 01/29/2024), Disp: , Rfl:    hydrOXYzine (ATARAX/VISTARIL) 50 MG tablet, Take 100 mg by mouth 2 (two) times daily as needed for anxiety. (Patient not taking: Reported on 01/29/2024), Disp: , Rfl:    Iron, Ferrous Sulfate, 325 (65 Fe) MG TABS, 1 tablet Orally Once a day; Duration: 30 day(s) (Patient not taking: Reported on 01/29/2024), Disp: , Rfl:    nystatin  (MYCOSTATIN /NYSTOP ) powder, Apply 1 Application topically 2 (two) times daily. (Patient not taking: Reported on 01/29/2024), Disp: 15 g, Rfl: 2   sertraline (ZOLOFT) 50 MG tablet, Take 25 mg by mouth daily. (Patient not taking: Reported on 01/29/2024), Disp: , Rfl:    No Known Allergies  Past Medical History:  Diagnosis Date   Abnormal Pap smear of cervix    2/24 ascus hpv hr+   Anemia    Bilateral carpal tunnel syndrome 08/14/2017  Lupus    No pertinent past medical history    Vitamin D deficiency      Past Surgical History:  Procedure Laterality Date   CERVICAL BIOPSY  W/ LOOP ELECTRODE EXCISION  2017   CIN 3 Clear margins   LYMPHADENECTOMY     left side of neck, 2023   WISDOM TOOTH EXTRACTION      Family History  Problem Relation Age of Onset   Heart attack Father    Hypertension Maternal Grandmother    Hypertension Maternal Grandfather    Diabetes Maternal Grandfather    Heart disease Maternal Grandfather    Cancer Paternal Grandfather        Colon    Social History   Tobacco Use   Smoking status: Never    Smokeless tobacco: Never  Vaping Use   Vaping status: Never Used  Substance Use Topics   Alcohol use: Not Currently   Drug use: No    ROS Refer to HPI for ROS details.  Objective:   Vitals: BP 118/77 (BP Location: Left Arm)   Pulse 73   Temp 98.4 F (36.9 C) (Oral)   Resp 16   LMP 01/07/2024 (Exact Date)   SpO2 95%   Physical Exam Vitals and nursing note reviewed.  Constitutional:      General: She is not in acute distress.    Appearance: Normal appearance. She is well-developed. She is not ill-appearing or toxic-appearing.  HENT:     Head: Normocephalic and atraumatic.  Cardiovascular:     Rate and Rhythm: Normal rate.  Pulmonary:     Effort: Pulmonary effort is normal. No respiratory distress.  Musculoskeletal:        General: Normal range of motion.  Skin:    General: Skin is warm and dry.  Neurological:     General: No focal deficit present.     Mental Status: She is alert and oriented to person, place, and time.  Psychiatric:        Mood and Affect: Mood normal.        Behavior: Behavior normal.     Procedures  No results found for this or any previous visit (from the past 24 hours).  No results found.   Assessment and Plan :     Discharge Instructions       1. Strain of right subscapularis muscle, initial encounter (Primary) - diclofenac (VOLTAREN) 50 MG EC tablet; Take 1 tablet (50 mg total) by mouth 2 (two) times daily.  Dispense: 30 tablet; Refill: 1 - baclofen (LIORESAL) 10 MG tablet; Take 1 tablet (10 mg total) by mouth 3 (three) times daily.  Dispense: 30 each; Refill: 0 - Apply heat to the area 2-3 times a day to help with inflammation and muscle spasms. - No heavy lifting or strenuous physical activity until symptoms improved. -Continue to monitor symptoms for any change in severity if there is any escalation of current symptoms or development of new symptoms follow-up in ER for further evaluation and management.       Ediberto Sens  B Jerelyn Trimarco   Laura Osborne, [place], [place] 01/29/24 1133

## 2024-01-29 NOTE — ED Triage Notes (Signed)
 Pt reports thoracic back pain x3 days. No injuries or trauma. Pt reports she was cleaning her house with a lot of bending and stooping down - pain started shortly after and has not improved. Describes a pulling sensation and muscle spasms with most movement. Feels like I cannot fully straighten up or muscles will spasm. Has taken ibuprofen  with no relief - states she has to be careful with NSAIDs due to her lupus. Having trouble sleeping due to pain.
